# Patient Record
Sex: Female | Born: 1935 | Race: White | Hispanic: No | Marital: Married | Smoking: Former smoker
Health system: Southern US, Community
[De-identification: ages and names within clinical notes are randomized; demographics above are authoritative.]

## PROBLEM LIST (undated history)

## (undated) DIAGNOSIS — Z9889 Other specified postprocedural states: Secondary | ICD-10-CM

## (undated) DIAGNOSIS — M8580 Other specified disorders of bone density and structure, unspecified site: Secondary | ICD-10-CM

## (undated) DIAGNOSIS — I10 Essential (primary) hypertension: Secondary | ICD-10-CM

## (undated) DIAGNOSIS — E785 Hyperlipidemia, unspecified: Secondary | ICD-10-CM

## (undated) HISTORY — PX: TONSILLECTOMY: SUR1361

## (undated) HISTORY — DX: Other specified postprocedural states: Z98.890

## (undated) HISTORY — DX: Essential (primary) hypertension: I10

## (undated) HISTORY — DX: Hyperlipidemia, unspecified: E78.5

## (undated) HISTORY — DX: Other specified disorders of bone density and structure, unspecified site: M85.80

---

## 1998-09-16 ENCOUNTER — Other Ambulatory Visit: Admission: RE | Admit: 1998-09-16 | Discharge: 1998-09-16 | Payer: Self-pay | Admitting: Obstetrics and Gynecology

## 2000-01-11 ENCOUNTER — Encounter: Payer: Self-pay | Admitting: Obstetrics and Gynecology

## 2000-01-11 ENCOUNTER — Encounter: Admission: RE | Admit: 2000-01-11 | Discharge: 2000-01-11 | Payer: Self-pay | Admitting: Obstetrics and Gynecology

## 2000-09-19 ENCOUNTER — Other Ambulatory Visit: Admission: RE | Admit: 2000-09-19 | Discharge: 2000-09-19 | Payer: Self-pay | Admitting: Obstetrics and Gynecology

## 2001-02-15 ENCOUNTER — Encounter: Admission: RE | Admit: 2001-02-15 | Discharge: 2001-02-15 | Payer: Self-pay | Admitting: Obstetrics and Gynecology

## 2001-03-07 ENCOUNTER — Encounter: Admission: RE | Admit: 2001-03-07 | Discharge: 2001-03-07 | Payer: Self-pay | Admitting: Obstetrics and Gynecology

## 2001-03-07 ENCOUNTER — Encounter: Payer: Self-pay | Admitting: Obstetrics and Gynecology

## 2001-03-11 ENCOUNTER — Encounter: Payer: Self-pay | Admitting: Obstetrics and Gynecology

## 2001-03-11 ENCOUNTER — Encounter: Admission: RE | Admit: 2001-03-11 | Discharge: 2001-03-11 | Payer: Self-pay | Admitting: Obstetrics and Gynecology

## 2001-05-21 ENCOUNTER — Encounter: Payer: Self-pay | Admitting: Obstetrics and Gynecology

## 2001-05-21 ENCOUNTER — Encounter: Admission: RE | Admit: 2001-05-21 | Discharge: 2001-05-21 | Payer: Self-pay | Admitting: Obstetrics and Gynecology

## 2002-05-06 ENCOUNTER — Other Ambulatory Visit: Admission: RE | Admit: 2002-05-06 | Discharge: 2002-05-06 | Payer: Self-pay | Admitting: Obstetrics and Gynecology

## 2002-05-08 ENCOUNTER — Encounter: Payer: Self-pay | Admitting: Obstetrics and Gynecology

## 2002-05-08 ENCOUNTER — Encounter: Admission: RE | Admit: 2002-05-08 | Discharge: 2002-05-08 | Payer: Self-pay | Admitting: Obstetrics and Gynecology

## 2003-07-06 ENCOUNTER — Encounter: Admission: RE | Admit: 2003-07-06 | Discharge: 2003-07-06 | Payer: Self-pay | Admitting: Obstetrics and Gynecology

## 2004-05-24 ENCOUNTER — Ambulatory Visit: Payer: Self-pay | Admitting: Internal Medicine

## 2004-06-01 ENCOUNTER — Ambulatory Visit: Payer: Self-pay | Admitting: Internal Medicine

## 2004-09-06 ENCOUNTER — Encounter: Admission: RE | Admit: 2004-09-06 | Discharge: 2004-09-06 | Payer: Self-pay | Admitting: Obstetrics and Gynecology

## 2004-09-09 ENCOUNTER — Other Ambulatory Visit: Admission: RE | Admit: 2004-09-09 | Discharge: 2004-09-09 | Payer: Self-pay | Admitting: Obstetrics and Gynecology

## 2004-11-30 ENCOUNTER — Ambulatory Visit: Payer: Self-pay | Admitting: Internal Medicine

## 2005-05-10 ENCOUNTER — Ambulatory Visit: Payer: Self-pay | Admitting: Internal Medicine

## 2005-05-31 ENCOUNTER — Ambulatory Visit: Payer: Self-pay | Admitting: Internal Medicine

## 2005-06-22 ENCOUNTER — Ambulatory Visit: Payer: Self-pay | Admitting: Internal Medicine

## 2005-08-03 ENCOUNTER — Ambulatory Visit: Payer: Self-pay | Admitting: Internal Medicine

## 2005-11-08 ENCOUNTER — Encounter: Admission: RE | Admit: 2005-11-08 | Discharge: 2005-11-08 | Payer: Self-pay | Admitting: Obstetrics and Gynecology

## 2005-12-06 ENCOUNTER — Ambulatory Visit: Payer: Self-pay | Admitting: Internal Medicine

## 2006-05-25 ENCOUNTER — Ambulatory Visit: Payer: Self-pay | Admitting: Internal Medicine

## 2006-08-28 ENCOUNTER — Ambulatory Visit: Payer: Self-pay | Admitting: Internal Medicine

## 2006-08-28 LAB — CONVERTED CEMR LAB
ALT: 15 units/L (ref 0–40)
AST: 16 units/L (ref 0–37)
Albumin: 4.2 g/dL (ref 3.5–5.2)
Alkaline Phosphatase: 41 units/L (ref 39–117)
BUN: 17 mg/dL (ref 6–23)
Bilirubin, Direct: 0.2 mg/dL (ref 0.0–0.3)
CO2: 29 meq/L (ref 19–32)
Calcium: 9.7 mg/dL (ref 8.4–10.5)
Chloride: 106 meq/L (ref 96–112)
Cholesterol: 256 mg/dL (ref 0–200)
Creatinine, Ser: 0.8 mg/dL (ref 0.4–1.2)
Direct LDL: 169 mg/dL
GFR calc Af Amer: 91 mL/min
GFR calc non Af Amer: 75 mL/min
Glucose, Bld: 102 mg/dL — ABNORMAL HIGH (ref 70–99)
HDL: 67.1 mg/dL (ref >39.0)
Potassium: 4.6 meq/L (ref 3.5–5.1)
Sodium: 142 meq/L (ref 135–145)
Total Bilirubin: 0.8 mg/dL (ref 0.3–1.2)
Total CHOL/HDL Ratio: 3.8
Total Protein: 7 g/dL (ref 6.0–8.3)
Triglycerides: 112 mg/dL (ref 0–149)
VLDL: 22 mg/dL (ref 0–40)

## 2007-01-03 ENCOUNTER — Encounter: Payer: Self-pay | Admitting: Internal Medicine

## 2007-01-22 ENCOUNTER — Other Ambulatory Visit: Admission: RE | Admit: 2007-01-22 | Discharge: 2007-01-22 | Payer: Self-pay | Admitting: Obstetrics and Gynecology

## 2007-02-07 ENCOUNTER — Encounter: Payer: Self-pay | Admitting: Internal Medicine

## 2007-02-07 ENCOUNTER — Encounter: Admission: RE | Admit: 2007-02-07 | Discharge: 2007-02-07 | Payer: Self-pay | Admitting: Internal Medicine

## 2007-02-14 DIAGNOSIS — I10 Essential (primary) hypertension: Secondary | ICD-10-CM | POA: Insufficient documentation

## 2007-02-26 DIAGNOSIS — E785 Hyperlipidemia, unspecified: Secondary | ICD-10-CM

## 2007-02-27 ENCOUNTER — Ambulatory Visit: Payer: Self-pay | Admitting: Internal Medicine

## 2007-06-12 ENCOUNTER — Ambulatory Visit: Payer: Self-pay | Admitting: Internal Medicine

## 2007-08-21 ENCOUNTER — Ambulatory Visit: Payer: Self-pay | Admitting: Internal Medicine

## 2007-08-21 LAB — CONVERTED CEMR LAB
ALT: 19 units/L (ref 0–35)
AST: 19 units/L (ref 0–37)
Albumin: 4.2 g/dL (ref 3.5–5.2)
Alkaline Phosphatase: 35 units/L — ABNORMAL LOW (ref 39–117)
BUN: 16 mg/dL (ref 6–23)
Basophils Absolute: 0 10*3/uL (ref 0.0–0.1)
Basophils Relative: 0.7 % (ref 0.0–1.0)
Bilirubin Urine: NEGATIVE
Bilirubin, Direct: 0.2 mg/dL (ref 0.0–0.3)
CO2: 32 meq/L (ref 19–32)
Calcium: 10.5 mg/dL (ref 8.4–10.5)
Chloride: 104 meq/L (ref 96–112)
Cholesterol: 230 mg/dL (ref 0–200)
Creatinine, Ser: 0.7 mg/dL (ref 0.4–1.2)
Direct LDL: 140.3 mg/dL
Eosinophils Absolute: 0.1 10*3/uL (ref 0.0–0.6)
Eosinophils Relative: 2.4 % (ref 0.0–5.0)
GFR calc Af Amer: 106 mL/min
GFR calc non Af Amer: 87 mL/min
Glucose, Bld: 99 mg/dL (ref 70–99)
Glucose, Urine, Semiquant: 100
HCT: 39.5 % (ref 36.0–46.0)
HDL: 68.2 mg/dL (ref >39.0)
Hemoglobin: 13 g/dL (ref 12.0–15.0)
Ketones, urine, test strip: NEGATIVE
Lymphocytes Relative: 32.9 % (ref 12.0–46.0)
MCHC: 33 g/dL (ref 30.0–36.0)
MCV: 91.9 fL (ref 78.0–100.0)
Monocytes Absolute: 0.3 10*3/uL (ref 0.2–0.7)
Monocytes Relative: 7.8 % (ref 3.0–11.0)
Neutro Abs: 2.1 10*3/uL (ref 1.4–7.7)
Neutrophils Relative %: 56.2 % (ref 43.0–77.0)
Nitrite: NEGATIVE
Platelets: 251 10*3/uL (ref 150–400)
Potassium: 4.7 meq/L (ref 3.5–5.1)
Protein, U semiquant: NEGATIVE
RBC: 4.3 M/uL (ref 3.87–5.11)
RDW: 12.6 % (ref 11.5–14.6)
Sodium: 143 meq/L (ref 135–145)
Specific Gravity, Urine: >=1.03
TSH: 1.7 microintl units/mL (ref 0.35–5.50)
Total Bilirubin: 1 mg/dL (ref 0.3–1.2)
Total CHOL/HDL Ratio: 3.4
Total Protein: 6.9 g/dL (ref 6.0–8.3)
Triglycerides: 50 mg/dL (ref 0–149)
Urobilinogen, UA: 0.2
VLDL: 10 mg/dL (ref 0–40)
WBC Urine, dipstick: NEGATIVE
WBC: 3.7 10*3/uL — ABNORMAL LOW (ref 4.5–10.5)
pH: 5.5

## 2007-08-29 ENCOUNTER — Ambulatory Visit: Payer: Self-pay | Admitting: Internal Medicine

## 2007-08-29 DIAGNOSIS — B372 Candidiasis of skin and nail: Secondary | ICD-10-CM

## 2007-09-20 LAB — CONVERTED CEMR LAB: Pap Smear: NORMAL

## 2007-11-12 ENCOUNTER — Telehealth: Payer: Self-pay | Admitting: *Deleted

## 2007-11-13 ENCOUNTER — Ambulatory Visit: Payer: Self-pay | Admitting: Internal Medicine

## 2007-11-13 DIAGNOSIS — T887XXA Unspecified adverse effect of drug or medicament, initial encounter: Secondary | ICD-10-CM | POA: Insufficient documentation

## 2007-11-13 LAB — CONVERTED CEMR LAB
ALT: 15 units/L (ref 0–35)
AST: 16 units/L (ref 0–37)
Albumin: 4 g/dL (ref 3.5–5.2)
Alkaline Phosphatase: 46 units/L (ref 39–117)
Bilirubin, Direct: 0.1 mg/dL (ref 0.0–0.3)
Total Bilirubin: 0.6 mg/dL (ref 0.3–1.2)
Total Protein: 6.8 g/dL (ref 6.0–8.3)

## 2007-11-15 ENCOUNTER — Telehealth: Payer: Self-pay | Admitting: Internal Medicine

## 2008-03-05 ENCOUNTER — Ambulatory Visit: Payer: Self-pay | Admitting: Internal Medicine

## 2008-03-19 ENCOUNTER — Encounter: Admission: RE | Admit: 2008-03-19 | Discharge: 2008-03-19 | Payer: Self-pay | Admitting: Obstetrics and Gynecology

## 2008-05-27 ENCOUNTER — Encounter: Payer: Self-pay | Admitting: *Deleted

## 2008-07-01 ENCOUNTER — Telehealth: Payer: Self-pay | Admitting: Internal Medicine

## 2008-08-06 ENCOUNTER — Telehealth: Payer: Self-pay | Admitting: Internal Medicine

## 2008-08-26 ENCOUNTER — Ambulatory Visit: Payer: Self-pay | Admitting: Internal Medicine

## 2008-08-26 LAB — CONVERTED CEMR LAB
ALT: 14 units/L (ref 0–35)
AST: 14 units/L (ref 0–37)
Albumin: 3.9 g/dL (ref 3.5–5.2)
Alkaline Phosphatase: 39 units/L (ref 39–117)
BUN: 18 mg/dL (ref 6–23)
Basophils Absolute: 0 10*3/uL (ref 0.0–0.1)
Basophils Relative: 0.5 % (ref 0.0–3.0)
Bilirubin Urine: NEGATIVE
Bilirubin, Direct: 0.1 mg/dL (ref 0.0–0.3)
CO2: 31 meq/L (ref 19–32)
Calcium: 9.7 mg/dL (ref 8.4–10.5)
Chloride: 104 meq/L (ref 96–112)
Cholesterol: 254 mg/dL (ref 0–200)
Creatinine, Ser: 0.7 mg/dL (ref 0.4–1.2)
Direct LDL: 162.5 mg/dL
Eosinophils Absolute: 0.1 10*3/uL (ref 0.0–0.7)
Eosinophils Relative: 3.7 % (ref 0.0–5.0)
GFR calc Af Amer: 105 mL/min
GFR calc non Af Amer: 87 mL/min
Glucose, Bld: 98 mg/dL (ref 70–99)
Glucose, Urine, Semiquant: NEGATIVE
HCT: 37.5 % (ref 36.0–46.0)
HDL: 62 mg/dL (ref >39.0)
Hemoglobin: 12.9 g/dL (ref 12.0–15.0)
Ketones, urine, test strip: NEGATIVE
Lymphocytes Relative: 33.5 % (ref 12.0–46.0)
MCHC: 34.3 g/dL (ref 30.0–36.0)
MCV: 92.9 fL (ref 78.0–100.0)
Monocytes Absolute: 0.3 10*3/uL (ref 0.1–1.0)
Monocytes Relative: 8.5 % (ref 3.0–12.0)
Neutro Abs: 2.2 10*3/uL (ref 1.4–7.7)
Neutrophils Relative %: 53.8 % (ref 43.0–77.0)
Nitrite: NEGATIVE
Platelets: 244 10*3/uL (ref 150–400)
Potassium: 4.3 meq/L (ref 3.5–5.1)
Protein, U semiquant: NEGATIVE
RBC: 4.03 M/uL (ref 3.87–5.11)
RDW: 12.2 % (ref 11.5–14.6)
Sodium: 142 meq/L (ref 135–145)
Specific Gravity, Urine: 1.015
TSH: 2.12 microintl units/mL (ref 0.35–5.50)
Total Bilirubin: 0.8 mg/dL (ref 0.3–1.2)
Total CHOL/HDL Ratio: 4.1
Total Protein: 6.9 g/dL (ref 6.0–8.3)
Triglycerides: 96 mg/dL (ref 0–149)
Urobilinogen, UA: 0.2
VLDL: 19 mg/dL (ref 0–40)
WBC: 3.9 10*3/uL — ABNORMAL LOW (ref 4.5–10.5)
pH: 7

## 2008-09-02 ENCOUNTER — Ambulatory Visit: Payer: Self-pay | Admitting: Internal Medicine

## 2008-10-06 ENCOUNTER — Telehealth (INDEPENDENT_AMBULATORY_CARE_PROVIDER_SITE_OTHER): Payer: Self-pay | Admitting: *Deleted

## 2008-10-07 ENCOUNTER — Telehealth: Payer: Self-pay | Admitting: Internal Medicine

## 2008-10-21 LAB — CONVERTED CEMR LAB: Pap Smear: NORMAL

## 2008-12-14 ENCOUNTER — Ambulatory Visit: Payer: Self-pay | Admitting: Internal Medicine

## 2008-12-14 LAB — CONVERTED CEMR LAB
ALT: 19 units/L (ref 0–35)
AST: 18 units/L (ref 0–37)
Albumin: 4 g/dL (ref 3.5–5.2)
Alkaline Phosphatase: 43 units/L (ref 39–117)
Bilirubin, Direct: 0.1 mg/dL (ref 0.0–0.3)
Cholesterol: 222 mg/dL — ABNORMAL HIGH (ref 0–200)
Direct LDL: 142 mg/dL
HDL: 63.1 mg/dL (ref >39.00)
Total Bilirubin: 1 mg/dL (ref 0.3–1.2)
Total CHOL/HDL Ratio: 4
Total Protein: 7.1 g/dL (ref 6.0–8.3)
Triglycerides: 91 mg/dL (ref 0.0–149.0)
VLDL: 18.2 mg/dL (ref 0.0–40.0)

## 2008-12-31 ENCOUNTER — Ambulatory Visit: Payer: Self-pay | Admitting: Internal Medicine

## 2009-02-20 LAB — HM MAMMOGRAPHY

## 2009-05-19 ENCOUNTER — Ambulatory Visit: Payer: Self-pay | Admitting: Internal Medicine

## 2009-05-19 DIAGNOSIS — R635 Abnormal weight gain: Secondary | ICD-10-CM

## 2009-08-24 LAB — HM MAMMOGRAPHY

## 2009-09-07 ENCOUNTER — Encounter: Admission: RE | Admit: 2009-09-07 | Discharge: 2009-09-07 | Payer: Self-pay | Admitting: Obstetrics and Gynecology

## 2009-09-08 ENCOUNTER — Ambulatory Visit: Payer: Self-pay | Admitting: Internal Medicine

## 2009-09-08 LAB — CONVERTED CEMR LAB
ALT: 15 units/L (ref 0–35)
AST: 16 units/L (ref 0–37)
Albumin: 4 g/dL (ref 3.5–5.2)
Alkaline Phosphatase: 40 units/L (ref 39–117)
BUN: 14 mg/dL (ref 6–23)
Basophils Absolute: 0 10*3/uL (ref 0.0–0.1)
Basophils Relative: 0.6 % (ref 0.0–3.0)
Bilirubin Urine: NEGATIVE
Bilirubin, Direct: 0.2 mg/dL (ref 0.0–0.3)
Blood in Urine, dipstick: NEGATIVE
CO2: 32 meq/L (ref 19–32)
Calcium: 9.3 mg/dL (ref 8.4–10.5)
Chloride: 108 meq/L (ref 96–112)
Cholesterol: 185 mg/dL (ref 0–200)
Creatinine, Ser: 0.6 mg/dL (ref 0.4–1.2)
Eosinophils Absolute: 0.1 10*3/uL (ref 0.0–0.7)
Eosinophils Relative: 3.1 % (ref 0.0–5.0)
GFR calc non Af Amer: 103.7 mL/min (ref >60)
Glucose, Bld: 95 mg/dL (ref 70–99)
Glucose, Urine, Semiquant: NEGATIVE
HCT: 38 % (ref 36.0–46.0)
HDL: 72.9 mg/dL (ref >39.00)
Hemoglobin: 12.6 g/dL (ref 12.0–15.0)
Ketones, urine, test strip: NEGATIVE
LDL Cholesterol: 97 mg/dL (ref 0–99)
Lymphocytes Relative: 31 % (ref 12.0–46.0)
Lymphs Abs: 1.1 10*3/uL (ref 0.7–4.0)
MCHC: 33.1 g/dL (ref 30.0–36.0)
MCV: 94.3 fL (ref 78.0–100.0)
Monocytes Absolute: 0.3 10*3/uL (ref 0.1–1.0)
Monocytes Relative: 9 % (ref 3.0–12.0)
Neutro Abs: 2.1 10*3/uL (ref 1.4–7.7)
Neutrophils Relative %: 56.3 % (ref 43.0–77.0)
Nitrite: NEGATIVE
Platelets: 217 10*3/uL (ref 150.0–400.0)
Potassium: 4.2 meq/L (ref 3.5–5.1)
Protein, U semiquant: NEGATIVE
RBC: 4.03 M/uL (ref 3.87–5.11)
RDW: 12.2 % (ref 11.5–14.6)
Sodium: 142 meq/L (ref 135–145)
Specific Gravity, Urine: 1.02
TSH: 1.63 microintl units/mL (ref 0.35–5.50)
Total Bilirubin: 0.9 mg/dL (ref 0.3–1.2)
Total CHOL/HDL Ratio: 3
Total Protein: 6.9 g/dL (ref 6.0–8.3)
Triglycerides: 76 mg/dL (ref 0.0–149.0)
Urobilinogen, UA: 0.2
VLDL: 15.2 mg/dL (ref 0.0–40.0)
WBC Urine, dipstick: NEGATIVE
WBC: 3.6 10*3/uL — ABNORMAL LOW (ref 4.5–10.5)
pH: 8

## 2009-09-15 ENCOUNTER — Ambulatory Visit: Payer: Self-pay | Admitting: Internal Medicine

## 2010-04-18 ENCOUNTER — Ambulatory Visit: Payer: Self-pay | Admitting: Internal Medicine

## 2010-06-30 ENCOUNTER — Telehealth: Payer: Self-pay | Admitting: Internal Medicine

## 2010-07-12 ENCOUNTER — Telehealth: Payer: Self-pay | Admitting: Internal Medicine

## 2010-07-28 ENCOUNTER — Ambulatory Visit
Admission: RE | Admit: 2010-07-28 | Discharge: 2010-07-28 | Payer: Self-pay | Source: Home / Self Care | Attending: Internal Medicine | Admitting: Internal Medicine

## 2010-08-13 ENCOUNTER — Encounter: Payer: Self-pay | Admitting: Obstetrics and Gynecology

## 2010-08-21 LAB — CONVERTED CEMR LAB
Cholesterol, target level: 200 mg/dL
HDL goal, serum: 40 mg/dL
LDL Goal: 160 mg/dL

## 2010-08-25 NOTE — Assessment & Plan Note (Signed)
Summary: 6 month rov/njr rsc appt time/njr   Vital Signs:  Patient profile:   77 year old female Height:      62 inches Weight:      130 pounds BMI:     23.86 Temp:     98.2 degrees F oral Pulse rate:   72 / minute Resp:     14 per minute BP sitting:   136 / 80  (left arm)  Vitals Entered By: Willy Eddy, LPN (April 18, 2010 1:53 PM) CC: roa, Hypertension Management Is Patient Diabetic? No   Primary Care Provider:  Stacie Glaze MD  CC:  roa and Hypertension Management.  History of Present Illness: folow up vustion fro HTN and lipids and mild anxiety review of immunizations prior to trip  Follow-Up Visit      This is a 77 year old woman who presents for Follow-up visit.  The patient denies chest pain, palpitations, dizziness, syncope, low blood sugar symptoms, high blood sugar symptoms, edema, SOB, DOE, PND, and orthopnea.  Since the last visit the patient notes no new problems or concerns.  The patient reports taking meds as prescribed and monitoring BP.  When questioned about possible medication side effects, the patient notes none.    Hypertension History:      She denies headache, chest pain, palpitations, dyspnea with exertion, orthopnea, PND, peripheral edema, visual symptoms, neurologic problems, syncope, and side effects from treatment.        Positive major cardiovascular risk factors include female age 68 years old or older, hyperlipidemia, and hypertension.  Negative major cardiovascular risk factors include non-tobacco-user status.        Further assessment for target organ damage reveals no history of ASHD, stroke/TIA, or peripheral vascular disease.     Preventive Screening-Counseling & Management  Alcohol-Tobacco     Smoking Status: quit     Year Quit: 1975  Problems Prior to Update: 1)  Weight Gain  (ICD-783.1) 2)  Uns Advrs Eff Uns Rx Medicinal&biological Sbstnc  (ICD-995.20) 3)  Candidiasis of Skin and Nails  (ICD-112.3) 4)  Preventive Health  Care  (ICD-V70.0) 5)  Grief Reaction  (ICD-309.0) 6)  Hyperlipidemia  (ICD-272.4) 7)  Hypertension  (ICD-401.9)  Current Problems (verified): 1)  Weight Gain  (ICD-783.1) 2)  Uns Advrs Eff Uns Rx Medicinal&biological Sbstnc  (ICD-995.20) 3)  Candidiasis of Skin and Nails  (ICD-112.3) 4)  Preventive Health Care  (ICD-V70.0) 5)  Grief Reaction  (ICD-309.0) 6)  Hyperlipidemia  (ICD-272.4) 7)  Hypertension  (ICD-401.9)  Medications Prior to Update: 1)  Norvasc 5 Mg  Tabs (Amlodipine Besylate) .... Once Po Daily 2)  Multivitamins   Tabs (Multiple Vitamin) .... Once Daily 3)  Bayer Low Strength 81 Mg  Tbec (Aspirin) .... Once Daily 4)  Hydrochlorothiazide 12.5 Mg  Caps (Hydrochlorothiazide) .... Once Po  Daily 5)  Alprazolam 0.25 Mg  Tabs (Alprazolam) .Marland Kitchen.. 1 -2 Every 4-6 Hours As Needed 6)  Caltrate 600 1500 Mg  Tabs (Calcium Carbonate) .... Once Daily 7)  Fish Oil Concentrate 1000 Mg Caps (Omega-3 Fatty Acids) .... Two By Mouth Two Times A Day 8)  Red Yeast Rice Extract 600 Mg Caps (Red Yeast Rice Extract) .... One By Mouth Two Times A Day 9)  Promethazine Hcl 25 Mg Tabs (Promethazine Hcl) .... One By Mouth Q 6 Hours As Needed For Nausea  Current Medications (verified): 1)  Norvasc 5 Mg  Tabs (Amlodipine Besylate) .... Once Po Daily 2)  Multivitamins  Tabs (Multiple Vitamin) .... Once Daily 3)  Bayer Low Strength 81 Mg  Tbec (Aspirin) .... Once Daily 4)  Hydrochlorothiazide 12.5 Mg  Caps (Hydrochlorothiazide) .... Once Po  Daily 5)  Alprazolam 0.25 Mg  Tabs (Alprazolam) .Marland Kitchen.. 1 -2 Every 4-6 Hours As Needed 6)  Caltrate 600 1500 Mg  Tabs (Calcium Carbonate) .... Once Daily 7)  Fish Oil Concentrate 1000 Mg Caps (Omega-3 Fatty Acids) .... Two By Mouth Two Times A Day 8)  Red Yeast Rice Extract 600 Mg Caps (Red Yeast Rice Extract) .... One By Mouth Two Times A Day 9)  Promethazine Hcl 25 Mg Tabs (Promethazine Hcl) .... One By Mouth Q 6 Hours As Needed For Nausea 10)  Cipro 500 Mg Tabs  (Ciprofloxacin Hcl) .Marland Kitchen.. 1 Two Times A Day As Needed Travelers Diarrhea  Allergies (verified): 1)  ! Penicillin 2)  ! Codeine  Past History:  Family History: Last updated: 08/29/2007 mother passed at 15 AAA father 74 unknown Family History Hypertension  Social History: Last updated: 08/29/2007 Former Smoker Married Alcohol use-no Drug use-no Regular exercise-yes  Risk Factors: Exercise: yes (08/29/2007)  Risk Factors: Smoking Status: quit (04/18/2010)  Past medical, surgical, family and social histories (including risk factors) reviewed, and no changes noted (except as noted below).  Past Medical History: Reviewed history from 02/27/2007 and no changes required. Hypertension Lipids Hyperlipidemia osteopenia GXT had stress test 2007 MI work up that was neg  Past Surgical History: Reviewed history from 02/27/2007 and no changes required. Tonsillectomy Lumpectomy  bx of mass neg  Family History: Reviewed history from 08/29/2007 and no changes required. mother passed at 65 AAA father 7 unknown Family History Hypertension  Social History: Reviewed history from 08/29/2007 and no changes required. Former Smoker Married Alcohol use-no Drug use-no Regular exercise-yes  Review of Systems  The patient denies anorexia, fever, weight loss, weight gain, vision loss, decreased hearing, hoarseness, chest pain, syncope, dyspnea on exertion, peripheral edema, prolonged cough, headaches, hemoptysis, abdominal pain, melena, hematochezia, severe indigestion/heartburn, hematuria, incontinence, genital sores, muscle weakness, suspicious skin lesions, transient blindness, difficulty walking, depression, unusual weight change, abnormal bleeding, enlarged lymph nodes, angioedema, and breast masses.    Physical Exam  General:  Well-developed,well-nourished,in no acute distress; alert,appropriate and cooperative throughout examination Head:  Normocephalic and atraumatic without  obvious abnormalities. No apparent alopecia or balding. Eyes:  pupils equal and pupils round.   Ears:  R ear normal and L ear normal.   Nose:  no external deformity and no nasal discharge.   Neck:  No deformities, masses, or tenderness noted. Lungs:  Normal respiratory effort, chest expands symmetrically. Lungs are clear to auscultation, no crackles or wheezes. Heart:  Normal rate and regular rhythm. S1 and S2 normal without gallop, murmur, click, rub or other extra sounds. Abdomen:  Bowel sounds positive,abdomen soft and non-tender without masses, organomegaly or hernias noted. Msk:  normal ROM, no joint tenderness, and no joint swelling.   Pulses:  R and L carotid,radial,femoral,dorsalis pedis and posterior tibial pulses are full and equal bilaterally Extremities:  No clubbing, cyanosis, edema, or deformity noted with normal full range of motion of all joints.   Neurologic:  No cranial nerve deficits noted. Station and gait are normal. Plantar reflexes are down-going bilaterally. DTRs are symmetrical throughout. Sensory, motor and coordinative functions appear intact.   Complete Medication List: 1)  Norvasc 5 Mg Tabs (Amlodipine besylate) .... Once po daily 2)  Multivitamins Tabs (Multiple vitamin) .... Once daily 3)  Bayer Low Strength 81  Mg Tbec (Aspirin) .... Once daily 4)  Hydrochlorothiazide 12.5 Mg Caps (Hydrochlorothiazide) .... Once po  daily 5)  Alprazolam 0.25 Mg Tabs (Alprazolam) .Marland Kitchen.. 1 -2 every 4-6 hours as needed 6)  Caltrate 600 1500 Mg Tabs (Calcium carbonate) .... Once daily 7)  Fish Oil Concentrate 1000 Mg Caps (Omega-3 fatty acids) .... Two by mouth two times a day 8)  Red Yeast Rice Extract 600 Mg Caps (Red yeast rice extract) .... One by mouth two times a day 9)  Promethazine Hcl 25 Mg Tabs (Promethazine hcl) .... One by mouth q 6 hours as needed for nausea 10)  Cipro 500 Mg Tabs (Ciprofloxacin hcl) .Marland Kitchen.. 1 two times a day as needed travelers diarrhea  Hypertension  Assessment/Plan:      The patient's hypertensive risk group is category B: At least one risk factor (excluding diabetes) with no target organ damage.  Her calculated 10 year risk of coronary heart disease is 5 %.  Today's blood pressure is 136/80.  Her blood pressure goal is < 140/90.  Patient Instructions: 1)  Please schedule a follow-up appointment in 6 months.  CPX Prescriptions: ALPRAZOLAM 0.25 MG  TABS (ALPRAZOLAM) 1 -2 every 4-6 hours as needed  #30 x 1   Entered and Authorized by:   Stacie Glaze MD   Signed by:   Stacie Glaze MD on 04/18/2010   Method used:   Print then Give to Patient   RxID:   [id number] CIPRO 500 MG TABS (CIPROFLOXACIN HCL) 1 two times a day as needed travelers diarrhea  #14 x 0   Entered by:   Willy Eddy, LPN   Authorized by:   Stacie Glaze MD   Signed by:   Stacie Glaze MD on 04/18/2010   Method used:   Electronically to        [facility]. [phone]* (retail)       [address]       [place], [place]  [address]       Ph: [phone]       Fax: [phone]   RxID:   [id number]    Immunization History:  Influenza Immunization History:    Influenza:  historical (03/24/2010)

## 2010-08-25 NOTE — Assessment & Plan Note (Signed)
Summary: cough//ccm   Vital Signs:  Patient profile:   77 year old female Weight:      130 pounds Pulse rate:   80 / minute BP sitting:   150 / 90  Vitals Entered By: Kyung Rudd, CMA (July 28, 2010 12:14 PM) CC: c/o cold for 3wks but can't get rid of cough.   Primary Care Provider:  Stacie Glaze MD  CC:  c/o cold for 3wks but can't get rid of cough..  History of Present Illness: Patient presents to clinic as a workin for evaluation of cough. Notes 3wk of NP cough without hemoptysis. Cough worse during day. Attempted zicam at beginning of symptoms. No sick exposure. +malaise and denies rhinorrhea, f/c, dyspnea or wheezing. No other complaints.  Current Medications (verified): 1)  Norvasc 5 Mg  Tabs (Amlodipine Besylate) .... Once Po Daily 2)  Multivitamins   Tabs (Multiple Vitamin) .... Once Daily 3)  Bayer Low Strength 81 Mg  Tbec (Aspirin) .... Once Daily 4)  Hydrochlorothiazide 12.5 Mg  Caps (Hydrochlorothiazide) .... Once Po  Daily 5)  Alprazolam 0.25 Mg  Tabs (Alprazolam) .Marland Kitchen.. 1 -2 Every 4-6 Hours As Needed 6)  Caltrate 600 1500 Mg  Tabs (Calcium Carbonate) .... Once Daily 7)  Fish Oil Concentrate 1000 Mg Caps (Omega-3 Fatty Acids) .... Two By Mouth Two Times A Day 8)  Red Yeast Rice Extract 600 Mg Caps (Red Yeast Rice Extract) .... One By Mouth Two Times A Day 9)  Promethazine Hcl 25 Mg Tabs (Promethazine Hcl) .... One By Mouth Q 6 Hours As Needed For Nausea 10)  Cipro 500 Mg Tabs (Ciprofloxacin Hcl) .Marland Kitchen.. 1 Two Times A Day As Needed Travelers Diarrhea  Allergies (verified): 1)  ! Penicillin 2)  ! Codeine  Past History:  Past medical, surgical, family and social histories (including risk factors) reviewed for relevance to current acute and chronic problems.  Past Medical History: Reviewed history from 02/27/2007 and no changes required. Hypertension Lipids Hyperlipidemia osteopenia GXT had stress test 2007 MI work up that was neg  Past Surgical  History: Reviewed history from 02/27/2007 and no changes required. Tonsillectomy Lumpectomy  bx of mass neg  Family History: Reviewed history from 08/29/2007 and no changes required. mother passed at 51 AAA father 16 unknown Family History Hypertension  Social History: Reviewed history from 08/29/2007 and no changes required. Former Smoker Married Alcohol use-no Drug use-no Regular exercise-yes  Review of Systems      See HPI General:  Complains of fatigue and malaise; denies chills and sweats. Eyes:  Denies eye irritation and red eye. ENT:  Complains of nasal congestion and postnasal drainage; denies difficulty swallowing, ear discharge, and sore throat. Resp:  Complains of cough; denies coughing up blood, shortness of breath, sputum productive, and wheezing. Derm:  Denies changes in color of skin and rash.  Physical Exam  General:  Well-developed,well-nourished,in no acute distress; alert,appropriate and cooperative throughout examination Head:  Normocephalic and atraumatic without obvious abnormalities. No apparent alopecia or balding. Eyes:  pupils equal, pupils round, corneas and lenses clear, and no injection.   Ears:  External ear exam shows no significant lesions or deformities.  Otoscopic examination reveals clear canals, tympanic membranes are intact bilaterally without bulging, retraction, inflammation or discharge. Hearing is grossly normal bilaterally. Nose:  no external deformity, no nasal discharge, and no mucosal edema.   Mouth:  Oral mucosa and oropharynx without lesions or exudates.  Teeth in good repair. Neck:  No deformities, masses, or tenderness  noted. Lungs:  Normal respiratory effort, chest expands symmetrically. Lungs are clear to auscultation, no crackles or wheezes. Heart:  Normal rate and regular rhythm. S1 and S2 normal without gallop, murmur, click, rub or other extra sounds. Skin:  turgor normal, color normal, and no rashes.     Impression &  Recommendations:  Problem # 1:  ACUTE BRONCHITIS (ICD-466.0) Assessment New Begin abx tx. Followup if no improvement or worsening.  Her updated medication list for this problem includes:    Zithromax Z-pak 250 Mg Tabs (Azithromycin) .Marland Kitchen... As directed  Complete Medication List: 1)  Norvasc 5 Mg Tabs (Amlodipine besylate) .... Once po daily 2)  Multivitamins Tabs (Multiple vitamin) .... Once daily 3)  Bayer Low Strength 81 Mg Tbec (Aspirin) .... Once daily 4)  Hydrochlorothiazide 12.5 Mg Caps (Hydrochlorothiazide) .... Once po  daily 5)  Alprazolam 0.25 Mg Tabs (Alprazolam) .Marland Kitchen.. 1 -2 every 4-6 hours as needed 6)  Caltrate 600 1500 Mg Tabs (Calcium carbonate) .... Once daily 7)  Fish Oil Concentrate 1000 Mg Caps (Omega-3 fatty acids) .... Two by mouth two times a day 8)  Red Yeast Rice Extract 600 Mg Caps (Red yeast rice extract) .... One by mouth two times a day 9)  Promethazine Hcl 25 Mg Tabs (Promethazine hcl) .... One by mouth q 6 hours as needed for nausea 10)  Cipro 500 Mg Tabs (Ciprofloxacin hcl) .Marland Kitchen.. 1 two times a day as needed travelers diarrhea 11)  Zithromax Z-pak 250 Mg Tabs (Azithromycin) .... As directed Prescriptions: ZITHROMAX Z-PAK 250 MG TABS (AZITHROMYCIN) as directed  #1 x 0   Entered and Authorized by:   Edwyna Perfect MD   Signed by:   Edwyna Perfect MD on 07/28/2010   Method used:   Electronically to        [facility]. [phone]* (retail)       [address]       [place], [place]  [address]       Ph: [phone]       Fax: [phone]   RxID:   [id number]    Orders Added: 1)  Est. Patient Level III [47425]

## 2010-08-25 NOTE — Progress Notes (Signed)
Summary: cough  Phone Note Call from Patient Call back at [phone]   Caller: Patient---triage vm Reason for Call: Acute Illness Complaint: Cough/Sore throat Summary of Call: has a cough during ther day. What can she take? Initial call taken by: Warnell Forester,  July 12, 2010 9:42 AM  Follow-up for Phone Call        will try delsym Follow-up by: Willy Eddy, LPN,  July 12, 2010 1:46 PM

## 2010-08-25 NOTE — Assessment & Plan Note (Signed)
Summary: CPX / RS   Vital Signs:  Patient profile:   77 year old female Height:      62 inches Weight:      132 pounds BMI:     24.23 Temp:     98.2 degrees F oral Pulse rate:   721 / minute Resp:     14 per minute BP sitting:   132 / 80  (left arm)  Vitals Entered By: Willy Eddy, LPN (September 15, 2009 11:10 AM) CC: annual visit for disease managment/needs meds to take to [place]- c/o allergies   CC:  annual visit for disease managment/needs meds to take to [place]- c/o allergies.  History of Present Illness: The pt was asked about all immunizations, health maint. services that are appropriate to their age and was given guidance on diet exercize  and weight management     Preventive Screening-Counseling & Management  Alcohol-Tobacco     Smoking Status: quit     Year Quit: 1975  Problems Prior to Update: 1)  Weight Gain  (ICD-783.1) 2)  Uns Advrs Eff Uns Rx Medicinal&biological Sbstnc  (ICD-995.20) 3)  Candidiasis of Skin and Nails  (ICD-112.3) 4)  Preventive Health Care  (ICD-V70.0) 5)  Grief Reaction  (ICD-309.0) 6)  Hyperlipidemia  (ICD-272.4) 7)  Hypertension  (ICD-401.9)  Current Problems (verified): 1)  Weight Gain  (ICD-783.1) 2)  Uns Advrs Eff Uns Rx Medicinal&biological Sbstnc  (ICD-995.20) 3)  Candidiasis of Skin and Nails  (ICD-112.3) 4)  Preventive Health Care  (ICD-V70.0) 5)  Grief Reaction  (ICD-309.0) 6)  Hyperlipidemia  (ICD-272.4) 7)  Hypertension  (ICD-401.9)  Medications Prior to Update: 1)  Norvasc 5 Mg  Tabs (Amlodipine Besylate) .... Once Po Daily 2)  Multivitamins   Tabs (Multiple Vitamin) .... Once Daily 3)  Bayer Low Strength 81 Mg  Tbec (Aspirin) .... Once Daily 4)  Hydrochlorothiazide 12.5 Mg  Caps (Hydrochlorothiazide) .... Once Po  Daily 5)  Alprazolam 0.25 Mg  Tabs (Alprazolam) .Marland Kitchen.. 1 -2 Every 4-6 Hours As Needed 6)  Caltrate 600 1500 Mg  Tabs (Calcium Carbonate) .... Once Daily 7)  Fish Oil Concentrate 1000 Mg Caps (Omega-3  Fatty Acids) .... Two By Mouth Two Times A Day 8)  Red Yeast Rice Extract 600 Mg Caps (Red Yeast Rice Extract) .... One By Mouth Two Times A Day  Current Medications (verified): 1)  Norvasc 5 Mg  Tabs (Amlodipine Besylate) .... Once Po Daily 2)  Multivitamins   Tabs (Multiple Vitamin) .... Once Daily 3)  Bayer Low Strength 81 Mg  Tbec (Aspirin) .... Once Daily 4)  Hydrochlorothiazide 12.5 Mg  Caps (Hydrochlorothiazide) .... Once Po  Daily 5)  Alprazolam 0.25 Mg  Tabs (Alprazolam) .Marland Kitchen.. 1 -2 Every 4-6 Hours As Needed 6)  Caltrate 600 1500 Mg  Tabs (Calcium Carbonate) .... Once Daily 7)  Fish Oil Concentrate 1000 Mg Caps (Omega-3 Fatty Acids) .... Two By Mouth Two Times A Day 8)  Red Yeast Rice Extract 600 Mg Caps (Red Yeast Rice Extract) .... One By Mouth Two Times A Day  Allergies (verified): 1)  ! Penicillin 2)  ! Codeine  Past History:  Family History: Last updated: 08/29/2007 mother passed at 66 AAA father 80 unknown Family History Hypertension  Social History: Last updated: 08/29/2007 Former Smoker Married Alcohol use-no Drug use-no Regular exercise-yes  Risk Factors: Exercise: yes (08/29/2007)  Risk Factors: Smoking Status: quit (09/15/2009)  Past medical, surgical, family and social histories (including risk factors) reviewed, and no changes  noted (except as noted below).  Past Medical History: Reviewed history from 02/27/2007 and no changes required. Hypertension Lipids Hyperlipidemia osteopenia GXT had stress test 2007 MI work up that was neg  Past Surgical History: Reviewed history from 02/27/2007 and no changes required. Tonsillectomy Lumpectomy  bx of mass neg  Family History: Reviewed history from 08/29/2007 and no changes required. mother passed at 31 AAA father 14 unknown Family History Hypertension  Social History: Reviewed history from 08/29/2007 and no changes required. Former Smoker Married Alcohol use-no Drug use-no Regular  exercise-yes  Review of Systems  The patient denies anorexia, fever, weight loss, weight gain, vision loss, decreased hearing, hoarseness, chest pain, syncope, dyspnea on exertion, peripheral edema, prolonged cough, headaches, hemoptysis, abdominal pain, melena, hematochezia, severe indigestion/heartburn, hematuria, incontinence, genital sores, muscle weakness, suspicious skin lesions, transient blindness, difficulty walking, depression, unusual weight change, abnormal bleeding, enlarged lymph nodes, angioedema, and breast masses.    Physical Exam  General:  Well-developed,well-nourished,in no acute distress; alert,appropriate and cooperative throughout examination Head:  Normocephalic and atraumatic without obvious abnormalities. No apparent alopecia or balding. Eyes:  pupils equal and pupils round.   Ears:  R ear normal and L ear normal.   Nose:  no external deformity and no nasal discharge.   Neck:  No deformities, masses, or tenderness noted. Lungs:  Normal respiratory effort, chest expands symmetrically. Lungs are clear to auscultation, no crackles or wheezes. Heart:  Normal rate and regular rhythm. S1 and S2 normal without gallop, murmur, click, rub or other extra sounds. Abdomen:  Bowel sounds positive,abdomen soft and non-tender without masses, organomegaly or hernias noted. Pulses:  R and L carotid,radial,femoral,dorsalis pedis and posterior tibial pulses are full and equal bilaterally Extremities:  No clubbing, cyanosis, edema, or deformity noted with normal full range of motion of all joints.   Neurologic:  No cranial nerve deficits noted. Station and gait are normal. Plantar reflexes are down-going bilaterally. DTRs are symmetrical throughout. Sensory, motor and coordinative functions appear intact.   Impression & Recommendations:  Problem # 1:  PREVENTIVE HEALTH CARE (ICD-V70.0) Assessment Unchanged  Mammogram: normal (02/20/2009) Pap smear: normal (10/21/2008) Bone Density:  normal (02/07/2007) Td Booster: Td (08/29/2007)   Flu Vax: Fluvax 3+ (05/19/2009)   Pneumovax: Historical (05/24/2006) Chol: 185 (09/08/2009)   HDL: 72.90 (09/08/2009)   LDL: 97 (09/08/2009)   TG: 76.0 (09/08/2009) TSH: 1.63 (09/08/2009)   Next mammogram due:: 02/2010 (09/15/2009) Next Bone Density due:: 02/2010 (09/15/2009)  Discussed using sunscreen, use of alcohol, drug use, self breast exam, routine dental care, routine eye care, schedule for GYN exam, routine physical exam, seat belts, multiple vitamins, osteoporosis prevention, adequate calcium intake in diet, recommendations for immunizations, mammograms and Pap smears.  Discussed exercise and checking cholesterol.  Discussed gun safety, safe sex, and contraception.  Problem # 2:  HYPERTENSION (ICD-401.9) Assessment: Unchanged  Her updated medication list for this problem includes:    Norvasc 5 Mg Tabs (Amlodipine besylate) ..... Once po daily    Hydrochlorothiazide 12.5 Mg Caps (Hydrochlorothiazide) ..... Once po  daily  BP today: 132/80 Prior BP: 134/80 (05/19/2009)  Prior 10 Yr Risk Heart Disease: Not enough information (08/29/2007)  Labs Reviewed: K+: 4.2 (09/08/2009) Creat: : 0.6 (09/08/2009)   Chol: 185 (09/08/2009)   HDL: 72.90 (09/08/2009)   LDL: 97 (09/08/2009)   TG: 76.0 (09/08/2009)  Complete Medication List: 1)  Norvasc 5 Mg Tabs (Amlodipine besylate) .... Once po daily 2)  Multivitamins Tabs (Multiple vitamin) .... Once daily 3)  Bayer Low Strength  81 Mg Tbec (Aspirin) .... Once daily 4)  Hydrochlorothiazide 12.5 Mg Caps (Hydrochlorothiazide) .... Once po  daily 5)  Alprazolam 0.25 Mg Tabs (Alprazolam) .Marland Kitchen.. 1 -2 every 4-6 hours as needed 6)  Caltrate 600 1500 Mg Tabs (Calcium carbonate) .... Once daily 7)  Fish Oil Concentrate 1000 Mg Caps (Omega-3 fatty acids) .... Two by mouth two times a day 8)  Red Yeast Rice Extract 600 Mg Caps (Red yeast rice extract) .... One by mouth two times a day 9)  Ciprofloxacin Hcl  500 Mg Tabs (Ciprofloxacin hcl) .... One by mouth two times a day for 7 days as needed diarrhea 10)  Promethazine Hcl 25 Mg Tabs (Promethazine hcl) .... One by mouth q 6 hours as needed for nausea 11)  Lomotil 2.5-0.025 Mg Tabs (Diphenoxylate-atropine) .... One post loos stolls up to 4 a day  Patient Instructions: 1)  Please schedule a follow-up appointment in 6 months. 2)  Take an Aspirin every day. Prescriptions: LOMOTIL 2.5-0.025 MG TABS (DIPHENOXYLATE-ATROPINE) one post loos stolls up to 4 a day  #6 x 0   Entered and Authorized by:   Stacie Glaze MD   Signed by:   Stacie Glaze MD on 09/15/2009   Method used:   Print then Give to Patient   RxID:   [id number] PROMETHAZINE HCL 25 MG TABS (PROMETHAZINE HCL) one by mouth q 6 hours as needed for nausea  #6 x 0   Entered and Authorized by:   Stacie Glaze MD   Signed by:   Stacie Glaze MD on 09/15/2009   Method used:   Print then Give to Patient   RxID:   [id number] CIPROFLOXACIN HCL 500 MG TABS (CIPROFLOXACIN HCL) one by mouth two times a day for 7 days as needed diarrhea  #14 x 0   Entered and Authorized by:   Stacie Glaze MD   Signed by:   Stacie Glaze MD on 09/15/2009   Method used:   Print then Give to Patient   RxID:   [id number]    Preventive Care Screening  Bone Density:    Date:  02/07/2007    Next Due:  02/2010    Results:  normal std dev  Mammogram:    Date:  02/20/2009    Next Due:  02/2010    Results:  normal   Pap Smear:    Date:  10/21/2008    Next Due:  10/2009    Results:  normal

## 2010-08-25 NOTE — Progress Notes (Signed)
Summary: REFILL REQUEST  Phone Note Refill Request Message from:  Patient on June 30, 2010 3:47 PM  Refills Requested: Medication #1:  HYDROCHLOROTHIAZIDE 12.5 MG  CAPS once po  daily   Notes: [facility] [address]...Marland KitchenMarland Kitchen 90-day supply.  Medication #2:  NORVASC 5 MG  TABS once po daily   Notes: [facility] [address]...Marland KitchenMarland Kitchen 90-day supply.    Initial call taken by: Debbra Riding,  June 30, 2010 3:48 PM    Prescriptions: NORVASC 5 MG  TABS (AMLODIPINE BESYLATE) once po daily  #30 Tablet x 10   Entered by:   Willy Eddy, LPN   Authorized by:   Stacie Glaze MD   Signed by:   Willy Eddy, LPN on 02/72/5366   Method used:   Electronically to        [facility]. [phone]* (retail)       [address]       [place], [place]  [address]       Ph: [phone]       Fax: [phone]   RxID:   [id number] HYDROCHLOROTHIAZIDE 12.5 MG  CAPS (HYDROCHLOROTHIAZIDE) once po  daily  #30 Capsule x 8   Entered by:   Willy Eddy, LPN   Authorized by:   Stacie Glaze MD   Signed by:   Willy Eddy, LPN on 32/20/2542   Method used:   Electronically to        [facility]. [phone]* (retail)       [address]       [place], [place]  [address]       Ph: [phone]       Fax: [phone]   RxID:   [id number]

## 2010-09-26 ENCOUNTER — Other Ambulatory Visit: Payer: Self-pay | Admitting: Internal Medicine

## 2010-09-26 DIAGNOSIS — Z1231 Encounter for screening mammogram for malignant neoplasm of breast: Secondary | ICD-10-CM

## 2010-09-28 ENCOUNTER — Other Ambulatory Visit: Payer: Medicare Other | Admitting: Internal Medicine

## 2010-09-28 DIAGNOSIS — Z0000 Encounter for general adult medical examination without abnormal findings: Secondary | ICD-10-CM

## 2010-09-28 LAB — LIPID PANEL
Cholesterol: 193 mg/dL (ref 0–200)
HDL: 62.8 mg/dL (ref >39.00)
LDL Cholesterol: 109 mg/dL — ABNORMAL HIGH (ref 0–99)
Total CHOL/HDL Ratio: 3
Triglycerides: 104 mg/dL (ref 0.0–149.0)
VLDL: 20.8 mg/dL (ref 0.0–40.0)

## 2010-09-28 LAB — BASIC METABOLIC PANEL
BUN: 23 mg/dL (ref 6–23)
CO2: 29 mEq/L (ref 19–32)
Calcium: 9.7 mg/dL (ref 8.4–10.5)
Chloride: 105 mEq/L (ref 96–112)
Creatinine, Ser: 0.6 mg/dL (ref 0.4–1.2)
GFR: 105.43 mL/min (ref >60.00)
Glucose, Bld: 93 mg/dL (ref 70–99)
Potassium: 4.2 mEq/L (ref 3.5–5.1)
Sodium: 142 mEq/L (ref 135–145)

## 2010-09-28 LAB — HEPATIC FUNCTION PANEL
ALT: 17 U/L (ref 0–35)
AST: 17 U/L (ref 0–37)
Albumin: 4.2 g/dL (ref 3.5–5.2)
Alkaline Phosphatase: 41 U/L (ref 39–117)
Bilirubin, Direct: 0.1 mg/dL (ref 0.0–0.3)
Total Bilirubin: 0.8 mg/dL (ref 0.3–1.2)
Total Protein: 6.8 g/dL (ref 6.0–8.3)

## 2010-09-28 LAB — CBC WITH DIFFERENTIAL/PLATELET
Basophils Absolute: 0 10*3/uL (ref 0.0–0.1)
Basophils Relative: 0.6 % (ref 0.0–3.0)
Eosinophils Absolute: 0.1 10*3/uL (ref 0.0–0.7)
Eosinophils Relative: 2.7 % (ref 0.0–5.0)
HCT: 37.1 % (ref 36.0–46.0)
Hemoglobin: 12.8 g/dL (ref 12.0–15.0)
Lymphocytes Relative: 35.3 % (ref 12.0–46.0)
Lymphs Abs: 1.4 10*3/uL (ref 0.7–4.0)
MCHC: 34.6 g/dL (ref 30.0–36.0)
MCV: 91.4 fl (ref 78.0–100.0)
Monocytes Absolute: 0.3 10*3/uL (ref 0.1–1.0)
Monocytes Relative: 8.1 % (ref 3.0–12.0)
Neutro Abs: 2.2 10*3/uL (ref 1.4–7.7)
Neutrophils Relative %: 53.3 % (ref 43.0–77.0)
Platelets: 235 10*3/uL (ref 150.0–400.0)
RBC: 4.05 Mil/uL (ref 3.87–5.11)
RDW: 13.5 % (ref 11.5–14.6)
WBC: 4.1 10*3/uL — ABNORMAL LOW (ref 4.5–10.5)

## 2010-09-28 LAB — POCT URINALYSIS DIPSTICK
Bilirubin, UA: NEGATIVE
Glucose, UA: NEGATIVE
Ketones, UA: NEGATIVE
Leukocytes, UA: NEGATIVE
Nitrite, UA: NEGATIVE
Protein, UA: NEGATIVE
Spec Grav, UA: 1.02
Urobilinogen, UA: 0.2
pH, UA: 5

## 2010-09-28 LAB — TSH: TSH: 1.7 u[IU]/mL (ref 0.35–5.50)

## 2010-10-04 ENCOUNTER — Encounter: Payer: Self-pay | Admitting: Internal Medicine

## 2010-10-05 ENCOUNTER — Ambulatory Visit (INDEPENDENT_AMBULATORY_CARE_PROVIDER_SITE_OTHER): Payer: Medicare Other | Admitting: Internal Medicine

## 2010-10-05 ENCOUNTER — Encounter: Payer: Self-pay | Admitting: Internal Medicine

## 2010-10-05 VITALS — BP 130/80 | HR 72 | Temp 98.2°F | Resp 14 | Ht 62.0 in | Wt 130.0 lb

## 2010-10-05 DIAGNOSIS — E785 Hyperlipidemia, unspecified: Secondary | ICD-10-CM

## 2010-10-05 DIAGNOSIS — I10 Essential (primary) hypertension: Secondary | ICD-10-CM

## 2010-10-05 DIAGNOSIS — Z0000 Encounter for general adult medical examination without abnormal findings: Secondary | ICD-10-CM

## 2010-10-05 NOTE — Assessment & Plan Note (Signed)
She is at her current weight goal her cholesterol is improved in terms of the total cholesterol consistently since she began anti-hyperlipidemia medications however there is a slight bump in the LDL to 2 a slight decrease in activity we linked for the patient the relationship between her physical activity and her cholesterol

## 2010-10-05 NOTE — Progress Notes (Signed)
  Subjective:    Patient ID: Theresa Chambers, female    DOB: 06/02/1933, 77 y.o.   MRN: [id number]  HPI patient is a 77 year old white female who presents for her yearly wellness examination she is without complaints she is compliant with her hyperlipidemia medications and her hypertension medications she was treated for candidiasis of the skin and nails with success.   She denies any chest pain shortness of breath PND or orthopnea she exercises 3-4 times a week    Review of Systems  Constitutional: Negative for activity change, appetite change and fatigue.  HENT: Negative for ear pain, congestion, neck pain, postnasal drip and sinus pressure.   Eyes: Negative for redness and visual disturbance.  Respiratory: Negative for cough, shortness of breath and wheezing.   Gastrointestinal: Negative for abdominal pain and abdominal distention.  Genitourinary: Negative for dysuria, frequency and menstrual problem.  Musculoskeletal: Negative for myalgias, joint swelling and arthralgias.  Skin: Negative for rash and wound.  Neurological: Negative for dizziness, weakness and headaches.  Hematological: Negative for adenopathy. Does not bruise/bleed easily.  Psychiatric/Behavioral: Negative for sleep disturbance and decreased concentration.   Past Medical History  Diagnosis Date  . Hypertension   . Hyperlipidemia   . Osteopenia   . History of lumpectomy    Past Surgical History  Procedure Date  . Tonsillectomy   . Breast lumpectomy     reports that she has quit smoking. She does not have any smokeless tobacco history on file. She reports that she drinks alcohol. She reports that she does not use illicit drugs. family history includes Anuerysm in her mother and Hypertension in her father. Allergies  Allergen Reactions  . Codeine   . Penicillins        Objective:   Physical Exam  Constitutional: She is oriented to person, place, and time. She appears well-developed and  well-nourished. No distress.  HENT:  Head: Normocephalic and atraumatic.  Right Ear: External ear normal.  Left Ear: External ear normal.  Nose: Nose normal.  Mouth/Throat: Oropharynx is clear and moist.  Eyes: Conjunctivae and EOM are normal. Pupils are equal, round, and reactive to light.  Neck: Normal range of motion. Neck supple. No JVD present. No tracheal deviation present. No thyromegaly present.  Cardiovascular: Normal rate, regular rhythm, normal heart sounds and intact distal pulses.   No murmur heard. Pulmonary/Chest: Effort normal and breath sounds normal. She has no wheezes. She exhibits no tenderness.  Abdominal: Soft. Bowel sounds are normal.  Musculoskeletal: Normal range of motion. She exhibits no edema and no tenderness.  Lymphadenopathy:    She has no cervical adenopathy.  Neurological: She is alert and oriented to person, place, and time. She has normal reflexes. No cranial nerve deficit.  Skin: Skin is warm and dry. She is not diaphoretic.  Psychiatric: She has a normal mood and affect. Her behavior is normal.          Assessment & Plan:   This is a routine physical examination for this healthy  Female. Reviewed all health maintenance protocols including mammography colonoscopy bone density and reviewed appropriate screening labs. Her immunization history was reviewed as well as her current medications and allergies refills of her chronic medications were given and the plan for yearly health maintenance was discussed all orders and referrals were made as appropriate.

## 2010-10-05 NOTE — Assessment & Plan Note (Signed)
Stable on the combination of amlodipine and hydrochlorothiazide she has no edema of her lower extremities from the calcium channel blocker and she's tolerating it well excellent control of her blood pressure renal function is good we reviewed the medications and she is comfortable staying on his medications and we believe they are adequately controlled her blood pressure to goals

## 2010-10-18 ENCOUNTER — Ambulatory Visit
Admission: RE | Admit: 2010-10-18 | Discharge: 2010-10-18 | Disposition: A | Discharge status: Home / Self Care | Payer: 59 | Source: Ambulatory Visit | Attending: Internal Medicine | Admitting: Internal Medicine

## 2010-10-18 DIAGNOSIS — Z1231 Encounter for screening mammogram for malignant neoplasm of breast: Secondary | ICD-10-CM

## 2010-11-28 ENCOUNTER — Other Ambulatory Visit: Payer: Self-pay | Admitting: *Deleted

## 2010-11-28 MED ORDER — AMLODIPINE BESYLATE 5 MG PO TABS: 5.0000 mg | ORAL_TABLET | Freq: Every day | ORAL | Status: DC

## 2010-11-28 MED ORDER — HYDROCHLOROTHIAZIDE 12.5 MG PO TABS: 12.5000 mg | ORAL_TABLET | Freq: Every day | ORAL | Status: DC

## 2011-01-17 ENCOUNTER — Encounter: Payer: Self-pay | Admitting: Internal Medicine

## 2011-01-17 ENCOUNTER — Ambulatory Visit (INDEPENDENT_AMBULATORY_CARE_PROVIDER_SITE_OTHER): Payer: Medicare Other | Admitting: Internal Medicine

## 2011-01-17 VITALS — BP 124/78 | HR 91 | Temp 98.5°F | Wt 133.0 lb

## 2011-01-17 DIAGNOSIS — R109 Unspecified abdominal pain: Secondary | ICD-10-CM

## 2011-01-17 LAB — CBC WITH DIFFERENTIAL/PLATELET
Basophils Absolute: 0 10*3/uL (ref 0.0–0.1)
Basophils Relative: 0 % (ref 0.0–3.0)
Eosinophils Absolute: 0 10*3/uL (ref 0.0–0.7)
Hemoglobin: 13 g/dL (ref 12.0–15.0)
Lymphs Abs: 1 10*3/uL (ref 0.7–4.0)
MCHC: 34 g/dL (ref 30.0–36.0)
MCV: 92 fl (ref 78.0–100.0)
Monocytes Absolute: 1 10*3/uL (ref 0.1–1.0)
Neutro Abs: 8.6 10*3/uL — ABNORMAL HIGH (ref 1.4–7.7)
RBC: 4.15 Mil/uL (ref 3.87–5.11)
RDW: 13.1 % (ref 11.5–14.6)

## 2011-01-19 ENCOUNTER — Telehealth: Payer: Self-pay

## 2011-01-19 NOTE — Telephone Encounter (Signed)
Message copied by Beverely Low on Thu Jan 19, 2011  4:27 PM ------      Message from: Staci Righter      Created: Wed Jan 18, 2011  5:52 PM       Labs nl

## 2011-01-19 NOTE — Telephone Encounter (Signed)
Message copied by Beverely Low on Thu Jan 19, 2011  1:18 PM ------      Message from: Staci Righter      Created: Wed Jan 18, 2011  5:52 PM       Labs nl

## 2011-01-19 NOTE — Telephone Encounter (Signed)
Pt notified.  Pt c/o fever for the past 2 nights. Notes that fever is only at night and body is a "little achy." Pt will monitor temp tonight

## 2011-01-19 NOTE — Telephone Encounter (Signed)
Left message for pt to call back  °

## 2011-01-22 DIAGNOSIS — R109 Unspecified abdominal pain: Secondary | ICD-10-CM | POA: Insufficient documentation

## 2011-01-22 NOTE — Assessment & Plan Note (Signed)
Obtain CBC. Discussed plain abdominal radiograph and patient defers. Possible relationship to constipation with dietary changes recently. Attempt MiraLax. Present to the emergency department with increasing severe abdominal pain nausea vomiting fever chills.Followup if no improvement or worsening.

## 2011-01-22 NOTE — Progress Notes (Signed)
  Subjective:    Patient ID: Theresa Chambers, female    DOB: June 09, 1933, 77 y.o.   MRN: [id number]  HPI Patient presents to clinic for evaluation of abdominal pain. Notes 4 day history of right lower quadrant abdominal pain. States the pain began after a significant change in her diet including high protein ingestion of eggs and bacon. Subsequently developed constipation with hard stool. Has had no blood in her stool nausea vomiting fever or chills. No other exacerbating or alleviating factors. No other complaints.  Reviewed past medical history, medications and allergies  Review of Systems see history of present illness     Objective:   Physical Exam  Nursing note and vitals reviewed. Constitutional: She appears well-developed and well-nourished. No distress.  HENT:  Head: Normocephalic and atraumatic.  Eyes: Conjunctivae are normal. No scleral icterus.  Abdominal: Soft. Bowel sounds are normal. She exhibits no distension and no mass. There is no hepatosplenomegaly. There is tenderness in the right lower quadrant. There is no rigidity, no rebound, no guarding and no tenderness at McBurney's point. No hernia.  Neurological: She is alert.  Skin: Skin is warm and dry. She is not diaphoretic.  Psychiatric: She has a normal mood and affect.          Assessment & Plan:

## 2011-01-23 ENCOUNTER — Ambulatory Visit (INDEPENDENT_AMBULATORY_CARE_PROVIDER_SITE_OTHER): Payer: Medicare Other | Admitting: Internal Medicine

## 2011-01-23 ENCOUNTER — Telehealth: Payer: Self-pay

## 2011-01-23 ENCOUNTER — Encounter: Payer: Self-pay | Admitting: Internal Medicine

## 2011-01-23 VITALS — BP 120/76 | Temp 98.2°F | Wt 132.0 lb

## 2011-01-23 DIAGNOSIS — R350 Frequency of micturition: Secondary | ICD-10-CM

## 2011-01-23 DIAGNOSIS — R109 Unspecified abdominal pain: Secondary | ICD-10-CM

## 2011-01-23 LAB — POCT URINALYSIS DIPSTICK: Glucose, UA: NEGATIVE

## 2011-01-23 MED ORDER — LEVOFLOXACIN 500 MG PO TABS: 500.0000 mg | ORAL_TABLET | Freq: Every day | ORAL | Status: AC

## 2011-01-23 NOTE — Assessment & Plan Note (Signed)
Abdominal pain and tenderness significantly improved but not resolved. Has some mild change in urinary symptoms and urinalysis suggestive of UTI. However with history of diverticulosis on colonoscopy cannot exclude diverticulitis. Recommend empiric Levaquin 500 mg a day for seven days. Followup closely if no improvement or worsening over the next several days. I discussed possible abdominal CT if does not respond to a antibiotic therapy.

## 2011-01-23 NOTE — Progress Notes (Signed)
  Subjective:    Patient ID: Theresa Chambers, female    DOB: 09/10/1932, 77 y.o.   MRN: [id number]  HPI Pt presents to clinic for evaluation of fever. Seen approximately 6 days ago. At that time four day history of right lower quadrant abdominal pain which she related to constipation after significant change in her diet. CBC normal at that time. She treated her constipation with subsequent return to normal bowel movements. Also notes long time had significant improvement of her right upper quadrant pain which she states is 99% improved. Also approximately 6 days ago began to have intermittent fevers predominantly in the afternoon with a maximum temperature of 101.7. They have occurred daily. Only other associated symptoms if she were to have her call is any increase in nocturia and urinary urgency at the time. Denies dysuria or hematuria. Urinalysis obtained and reviewed the patient demonstrates +2 blood +1 protein and trace le. Colonoscopy dated June 2008 reviewed the finding of diverticulosis left greater than right. No other exacerbating or alleviating factors. Taking no medication for the problem. Denies other complaints.    Review of Systems see history of present illness     Objective:   Physical Exam  Nursing note and vitals reviewed. Constitutional: She appears well-developed and well-nourished. No distress.  HENT:  Head: Normocephalic and atraumatic.  Eyes: Conjunctivae are normal. No scleral icterus.  Abdominal: Soft. Bowel sounds are normal. She exhibits no distension and no mass. There is tenderness. There is no rebound and no guarding.       Minimal tenderness right lower quadrant. Significant improvement from last visit  Neurological: She is alert.  Skin: Skin is warm and dry. She is not diaphoretic.  Psychiatric: She has a normal mood and affect.          Assessment & Plan:

## 2011-01-23 NOTE — Telephone Encounter (Signed)
Pt left message stating she is still running a fever in the afternoons and evenings. Notes that the fever was 101.7 yesterday around 2pm. She also stated that she now has a cough.  Attempted to return pt call to assess other sxs. Left message for pt to call back for triage or to schedule an appointment.

## 2011-03-01 ENCOUNTER — Telehealth: Payer: Self-pay | Admitting: Internal Medicine

## 2011-03-01 MED ORDER — AMLODIPINE BESYLATE 5 MG PO TABS: 5.0000 mg | ORAL_TABLET | Freq: Every day | ORAL | Status: DC

## 2011-03-01 MED ORDER — ALPRAZOLAM 0.25 MG PO TABS: 0.2500 mg | ORAL_TABLET | Freq: Three times a day (TID) | ORAL | Status: DC | PRN

## 2011-03-01 MED ORDER — CIPROFLOXACIN HCL 500 MG PO TABS: 500.0000 mg | ORAL_TABLET | Freq: Two times a day (BID) | ORAL | Status: AC | PRN

## 2011-03-01 MED ORDER — PROMETHAZINE HCL 25 MG PO TABS: 25.0000 mg | ORAL_TABLET | Freq: Four times a day (QID) | ORAL | Status: DC | PRN

## 2011-03-01 MED ORDER — DIPHENOXYLATE-ATROPINE 2.5-0.025 MG PO TABS: 1.0000 | ORAL_TABLET | Freq: Four times a day (QID) | ORAL | Status: AC | PRN

## 2011-03-01 MED ORDER — HYDROCHLOROTHIAZIDE 12.5 MG PO TABS: 12.5000 mg | ORAL_TABLET | Freq: Every day | ORAL | Status: DC

## 2011-03-01 NOTE — Telephone Encounter (Signed)
Pt called req refill of generic for Lomotil 15 mg for loose stools, promethazine (PHENERGAN) 25 MG tablet, Ciprofloxacin 500 mg, Alprazolam .25 mg. Pt also needs a 30 day supply of HCTZ 12.5 and the Norvasc 5 mg 30 day supply.  Pls call in to [facility] on [address]. Pt is taking a trip in 3 wks to [place]. Also pt needs to know if she needs any immunizations for trip and also pt may need to get a zpak to take on trip, as suggested by Dr Londell Moh.

## 2011-03-09 ENCOUNTER — Telehealth: Payer: Self-pay | Admitting: Internal Medicine

## 2011-03-09 NOTE — Telephone Encounter (Signed)
Left message on machine PER TRAVEL MEDICINE MAY TAKE TDAP AS LONG IT HAS BEEN A YEAR-WILL CALL AND SCHEDULE

## 2011-03-09 NOTE — Telephone Encounter (Signed)
Pt is going to [place] and wanted to know if she should get a shot for wooping cough? please contact pt.

## 2011-03-10 ENCOUNTER — Ambulatory Visit (INDEPENDENT_AMBULATORY_CARE_PROVIDER_SITE_OTHER): Payer: 59 | Admitting: Internal Medicine

## 2011-03-10 DIAGNOSIS — Z0000 Encounter for general adult medical examination without abnormal findings: Secondary | ICD-10-CM

## 2011-03-10 DIAGNOSIS — Z23 Encounter for immunization: Secondary | ICD-10-CM

## 2011-04-19 ENCOUNTER — Ambulatory Visit (INDEPENDENT_AMBULATORY_CARE_PROVIDER_SITE_OTHER): Payer: 59 | Admitting: Internal Medicine

## 2011-04-19 ENCOUNTER — Encounter: Payer: Self-pay | Admitting: Internal Medicine

## 2011-04-19 VITALS — BP 124/80 | HR 72 | Temp 98.2°F | Resp 16 | Ht 62.0 in | Wt 134.0 lb

## 2011-04-19 DIAGNOSIS — Z23 Encounter for immunization: Secondary | ICD-10-CM

## 2011-04-19 DIAGNOSIS — E785 Hyperlipidemia, unspecified: Secondary | ICD-10-CM

## 2011-04-19 DIAGNOSIS — Z0000 Encounter for general adult medical examination without abnormal findings: Secondary | ICD-10-CM

## 2011-04-19 DIAGNOSIS — I10 Essential (primary) hypertension: Secondary | ICD-10-CM

## 2011-04-19 NOTE — Progress Notes (Signed)
  Subjective:    Patient ID: Theresa Chambers, female    DOB: 1933-02-17, 77 y.o.   MRN: [id number]  HPI The patient is a 77 year old female in excellent health with hypertension hyperlipidemia and osteopenia she is on amlodipine and hydrochlorothiazide with good control of her blood pressure.  Her weight is stable she is stable on all medications that she is due for physical in February we will defer her blood work to February  Review of Systems  Constitutional: Negative for activity change, appetite change and fatigue.  HENT: Negative for ear pain, congestion, neck pain, postnasal drip and sinus pressure.   Eyes: Negative for redness and visual disturbance.  Respiratory: Negative for cough, shortness of breath and wheezing.   Gastrointestinal: Negative for abdominal pain and abdominal distention.  Genitourinary: Negative for dysuria, frequency and menstrual problem.  Musculoskeletal: Negative for myalgias, joint swelling and arthralgias.  Skin: Negative for rash and wound.  Neurological: Negative for dizziness, weakness and headaches.  Hematological: Negative for adenopathy. Does not bruise/bleed easily.  Psychiatric/Behavioral: Negative for sleep disturbance and decreased concentration.       Objective:   Physical Exam  Vitals reviewed. Constitutional: She is oriented to person, place, and time. She appears well-developed and well-nourished. No distress.  HENT:  Head: Normocephalic and atraumatic.  Right Ear: External ear normal.  Left Ear: External ear normal.  Nose: Nose normal.  Mouth/Throat: Oropharynx is clear and moist.  Eyes: Conjunctivae and EOM are normal. Pupils are equal, round, and reactive to light.  Neck: Normal range of motion. Neck supple. No JVD present. No tracheal deviation present. No thyromegaly present.  Cardiovascular: Normal rate, regular rhythm, normal heart sounds and intact distal pulses.   No murmur heard. Pulmonary/Chest: Effort normal and  breath sounds normal. She has no wheezes. She exhibits no tenderness.  Abdominal: Soft. Bowel sounds are normal.  Musculoskeletal: Normal range of motion. She exhibits no edema and no tenderness.  Lymphadenopathy:    She has no cervical adenopathy.  Neurological: She is alert and oriented to person, place, and time. She has normal reflexes. No cranial nerve deficit.  Skin: Skin is warm and dry. She is not diaphoretic.  Psychiatric: She has a normal mood and affect. Her behavior is normal.          Assessment & Plan:  Patient's blood pressure stable on 2 drugs she has no peripheral edema and no signs of heart failure she is stable her current medications recommend continuing hydrochlorothiazide and amlodipine all of the medications reviewed with the patient including her aspirin a day her visual capsules and her red right sheath for control of her other risk factor which is hyperlipidemia.  Re emphasis on continued exercise and mobility is the best thing to promote longevity

## 2011-04-19 NOTE — Patient Instructions (Signed)
cpx in feb

## 2011-07-06 ENCOUNTER — Other Ambulatory Visit: Payer: Self-pay | Admitting: Internal Medicine

## 2011-07-13 ENCOUNTER — Other Ambulatory Visit: Payer: Self-pay | Admitting: Internal Medicine

## 2011-10-02 ENCOUNTER — Other Ambulatory Visit (INDEPENDENT_AMBULATORY_CARE_PROVIDER_SITE_OTHER): Payer: Medicare Other

## 2011-10-02 DIAGNOSIS — Z0000 Encounter for general adult medical examination without abnormal findings: Secondary | ICD-10-CM | POA: Diagnosis not present

## 2011-10-02 DIAGNOSIS — E785 Hyperlipidemia, unspecified: Secondary | ICD-10-CM | POA: Diagnosis not present

## 2011-10-02 DIAGNOSIS — I10 Essential (primary) hypertension: Secondary | ICD-10-CM | POA: Diagnosis not present

## 2011-10-02 LAB — LDL CHOLESTEROL, DIRECT: Direct LDL: 132.7 mg/dL

## 2011-10-02 LAB — POCT URINALYSIS DIPSTICK
Bilirubin, UA: NEGATIVE
Ketones, UA: NEGATIVE
Nitrite, UA: NEGATIVE
Protein, UA: NEGATIVE

## 2011-10-02 LAB — LIPID PANEL
Total CHOL/HDL Ratio: 3
VLDL: 24.8 mg/dL (ref 0.0–40.0)

## 2011-10-02 LAB — TSH: TSH: 1.96 u[IU]/mL (ref 0.35–5.50)

## 2011-10-02 LAB — CBC WITH DIFFERENTIAL/PLATELET
Basophils Absolute: 0 10*3/uL (ref 0.0–0.1)
Eosinophils Absolute: 0.2 10*3/uL (ref 0.0–0.7)
Hemoglobin: 12.9 g/dL (ref 12.0–15.0)
Lymphocytes Relative: 28.3 % (ref 12.0–46.0)
Monocytes Relative: 7.8 % (ref 3.0–12.0)
Neutro Abs: 2.6 10*3/uL (ref 1.4–7.7)
Neutrophils Relative %: 58.7 % (ref 43.0–77.0)
RDW: 13.4 % (ref 11.5–14.6)

## 2011-10-02 LAB — BASIC METABOLIC PANEL
Chloride: 104 mEq/L (ref 96–112)
Creatinine, Ser: 0.6 mg/dL (ref 0.4–1.2)
GFR: 95.73 mL/min (ref >60.00)
Potassium: 3.8 mEq/L (ref 3.5–5.1)

## 2011-10-02 LAB — HEPATIC FUNCTION PANEL
AST: 16 U/L (ref 0–37)
Alkaline Phosphatase: 45 U/L (ref 39–117)
Total Bilirubin: 0.5 mg/dL (ref 0.3–1.2)

## 2011-10-03 LAB — VITAMIN D 25 HYDROXY (VIT D DEFICIENCY, FRACTURES): Vit D, 25-Hydroxy: 50 ng/mL (ref 30–89)

## 2011-10-09 ENCOUNTER — Ambulatory Visit (INDEPENDENT_AMBULATORY_CARE_PROVIDER_SITE_OTHER): Payer: Medicare Other | Admitting: Internal Medicine

## 2011-10-09 ENCOUNTER — Encounter: Payer: Self-pay | Admitting: Internal Medicine

## 2011-10-09 VITALS — BP 154/90 | HR 80 | Temp 98.6°F | Resp 16 | Ht 62.0 in | Wt 130.0 lb

## 2011-10-09 DIAGNOSIS — I10 Essential (primary) hypertension: Secondary | ICD-10-CM

## 2011-10-09 DIAGNOSIS — E785 Hyperlipidemia, unspecified: Secondary | ICD-10-CM

## 2011-10-09 DIAGNOSIS — Z0000 Encounter for general adult medical examination without abnormal findings: Secondary | ICD-10-CM

## 2011-10-09 DIAGNOSIS — T887XXA Unspecified adverse effect of drug or medicament, initial encounter: Secondary | ICD-10-CM

## 2011-10-09 DIAGNOSIS — N390 Urinary tract infection, site not specified: Secondary | ICD-10-CM

## 2011-10-09 MED ORDER — OMEGA-3 FATTY ACIDS 1000 MG PO CAPS: 1.0000 g | ORAL_CAPSULE | Freq: Two times a day (BID) | ORAL | Status: DC

## 2011-10-09 MED ORDER — CIPROFLOXACIN HCL 250 MG PO TABS: 250.0000 mg | ORAL_TABLET | Freq: Two times a day (BID) | ORAL | Status: AC

## 2011-10-09 NOTE — Progress Notes (Signed)
Subjective:    Patient ID: Theresa Chambers, female    DOB: Feb 26, 1933, 78 y.o.   MRN: [id number]  HPI  Pt presents for follow up. Has noted increased anxiety. Blood pressure is higher with the anxiety. Follow up for lipids   Review of Systems  Constitutional: Negative for activity change, appetite change and fatigue.  HENT: Negative for ear pain, congestion, neck pain, postnasal drip and sinus pressure.   Eyes: Negative for redness and visual disturbance.  Respiratory: Negative for cough, shortness of breath and wheezing.   Gastrointestinal: Negative for abdominal pain and abdominal distention.  Genitourinary: Negative for dysuria, frequency and menstrual problem.  Musculoskeletal: Negative for myalgias, joint swelling and arthralgias.  Skin: Negative for rash and wound.  Neurological: Negative for dizziness, weakness and headaches.  Hematological: Negative for adenopathy. Does not bruise/bleed easily.  Psychiatric/Behavioral: Negative for sleep disturbance and decreased concentration.   Past Medical History  Diagnosis Date  . Hypertension   . Hyperlipidemia   . Osteopenia   . History of lumpectomy     History   Social History  . Marital Status: Married    Spouse Name: N/A    Number of Children: N/A  . Years of Education: N/A   Occupational History  . Not on file.   Social History Main Topics  . Smoking status: Former Games developer  . Smokeless tobacco: Not on file   Comment: quit in 1975  . Alcohol Use: Yes  . Drug Use: No  . Sexually Active: Not Currently   Other Topics Concern  . Not on file   Social History Narrative  . No narrative on file    Past Surgical History  Procedure Date  . Tonsillectomy   . Breast lumpectomy     Family History  Problem Relation Age of Onset  . Anuerysm Mother   . Hypertension Father     Allergies  Allergen Reactions  . Codeine   . Penicillins     Current Outpatient Prescriptions on File Prior to Visit    Medication Sig Dispense Refill  . ALPRAZolam (XANAX) 0.25 MG tablet Take 0.25 mg by mouth. 1-2 every 4-6 hrs as needed       . amLODipine (NORVASC) 5 MG tablet take 1 tablet by mouth once daily  30 tablet  3  . aspirin 81 MG tablet Take 81 mg by mouth daily.        . Calcium Carbonate (CALTRATE 600 PO) Take by mouth daily.        . diphenoxylate-atropine (LOMOTIL) 2.5-0.025 MG per tablet Take 1 tablet by mouth 4 (four) times daily as needed for diarrhea/loose stools.  30 tablet  1  . fish oil-omega-3 fatty acids 1000 MG capsule Take 2 g by mouth 2 (two) times daily.        . hydrochlorothiazide (MICROZIDE) 12.5 MG capsule take 1 tablet by mouth once daily  30 capsule  3  . Multiple Vitamin (MULTIVITAMIN) tablet Take 1 tablet by mouth daily.        . promethazine (PHENERGAN) 25 MG tablet Take 1 tablet (25 mg total) by mouth every 6 (six) hours as needed.  30 tablet  0  . Red Yeast Rice 600 MG CAPS Take by mouth daily.          BP 154/90  Pulse 80  Temp 98.6 F (37 C)  Resp 16  Ht 5\' 2"  (1.575 m)  Wt 130 lb (58.968 kg)  BMI 23.78 kg/m2  Objective:   Physical Exam  Nursing note and vitals reviewed. Constitutional: She is oriented to person, place, and time. She appears well-developed and well-nourished. No distress.  HENT:  Head: Normocephalic and atraumatic.  Right Ear: External ear normal.  Left Ear: External ear normal.  Nose: Nose normal.  Mouth/Throat: Oropharynx is clear and moist.  Eyes: Conjunctivae and EOM are normal. Pupils are equal, round, and reactive to light.  Neck: Normal range of motion. Neck supple. No JVD present. No tracheal deviation present. No thyromegaly present.  Cardiovascular: Normal rate, regular rhythm, normal heart sounds and intact distal pulses.   No murmur heard. Pulmonary/Chest: Effort normal and breath sounds normal. She has no wheezes. She exhibits no tenderness.  Abdominal: Soft. Bowel sounds are normal.  Musculoskeletal: Normal range  of motion. She exhibits no edema and no tenderness.  Lymphadenopathy:    She has no cervical adenopathy.  Neurological: She is alert and oriented to person, place, and time. She has normal reflexes. No cranial nerve deficit.  Skin: Skin is warm and dry. She is not diaphoretic.  Psychiatric: She has a normal mood and affect. Her behavior is normal.          Assessment & Plan:  Theresa Chambers is moderately elevated possibly due to anxiety we will monitor her blood pressure carefully and make sure that this does not indicate a rise in blood pressure.  I suspect with moderate anxiety is more likely to be hypoglycemia she's given up concentrated sweets. Passed clock test Possible anxiety? Subjective:    Theresa Chambers is a 78 y.o. female who presents for Medicare Annual/Subsequent preventive examination.  Preventive Screening-Counseling & Management  Tobacco History  Smoking status  . Former Smoker  Smokeless tobacco  . Not on file  Comment: quit in 1975     Problems Prior to Visit 1.   Current Problems (verified) Patient Active Problem List  Diagnoses  . CANDIDIASIS OF SKIN AND NAILS  . HYPERLIPIDEMIA  . HYPERTENSION  . UNS ADVRS EFF UNS RX MEDICINAL&BIOLOGICAL SBSTNC  . Abdominal  pain, other specified site    Medications Prior to Visit Current Outpatient Prescriptions on File Prior to Visit  Medication Sig Dispense Refill  . ALPRAZolam (XANAX) 0.25 MG tablet Take 0.25 mg by mouth. 1-2 every 4-6 hrs as needed       . amLODipine (NORVASC) 5 MG tablet take 1 tablet by mouth once daily  30 tablet  3  . aspirin 81 MG tablet Take 81 mg by mouth daily.        . Calcium Carbonate (CALTRATE 600 PO) Take by mouth daily.        . diphenoxylate-atropine (LOMOTIL) 2.5-0.025 MG per tablet Take 1 tablet by mouth 4 (four) times daily as needed for diarrhea/loose stools.  30 tablet  1  . fish oil-omega-3 fatty acids 1000 MG capsule Take 2 g by mouth 2 (two) times daily.        .  hydrochlorothiazide (MICROZIDE) 12.5 MG capsule take 1 tablet by mouth once daily  30 capsule  3  . Multiple Vitamin (MULTIVITAMIN) tablet Take 1 tablet by mouth daily.        . promethazine (PHENERGAN) 25 MG tablet Take 1 tablet (25 mg total) by mouth every 6 (six) hours as needed.  30 tablet  0  . Red Yeast Rice 600 MG CAPS Take by mouth daily.          Current Medications (verified) Current Outpatient Prescriptions  Medication  Sig Dispense Refill  . ALPRAZolam (XANAX) 0.25 MG tablet Take 0.25 mg by mouth. 1-2 every 4-6 hrs as needed       . amLODipine (NORVASC) 5 MG tablet take 1 tablet by mouth once daily  30 tablet  3  . aspirin 81 MG tablet Take 81 mg by mouth daily.        . Calcium Carbonate (CALTRATE 600 PO) Take by mouth daily.        . diphenoxylate-atropine (LOMOTIL) 2.5-0.025 MG per tablet Take 1 tablet by mouth 4 (four) times daily as needed for diarrhea/loose stools.  30 tablet  1  . fish oil-omega-3 fatty acids 1000 MG capsule Take 2 g by mouth 2 (two) times daily.        . hydrochlorothiazide (MICROZIDE) 12.5 MG capsule take 1 tablet by mouth once daily  30 capsule  3  . Multiple Vitamin (MULTIVITAMIN) tablet Take 1 tablet by mouth daily.        . promethazine (PHENERGAN) 25 MG tablet Take 1 tablet (25 mg total) by mouth every 6 (six) hours as needed.  30 tablet  0  . Red Yeast Rice 600 MG CAPS Take by mouth daily.           Allergies (verified) Codeine and Penicillins   PAST HISTORY  Family History Family History  Problem Relation Age of Onset  . Anuerysm Mother   . Hypertension Father     Social History History  Substance Use Topics  . Smoking status: Former Games developer  . Smokeless tobacco: Not on file   Comment: quit in 1975  . Alcohol Use: Yes     Are there smokers in your home (other than you)? No  Risk Factors Current exercise habits: The patient does not participate in regular exercise at present.  Dietary issues discussed: on restricted sweets diet     Cardiac risk factors: advanced age (older than 23 for men, 39 for women), hypertension and sedentary lifestyle.  Depression Screen (Note: if answer to either of the following is "Yes", a more complete depression screening is indicated)   Over the past two weeks, have you felt down, depressed or hopeless? No  Over the past two weeks, have you felt little interest or pleasure in doing things? No  Have you lost interest or pleasure in daily life? No  Do you often feel hopeless? No  Do you cry easily over simple problems? No  Activities of Daily Living In your present state of health, do you have any difficulty performing the following activities?:  Driving? No Managing money?  No Feeding yourself? No Getting from bed to chair? No Climbing a flight of stairs? No Preparing food and eating?: No Bathing or showering? No Getting dressed: No Getting to the toilet? No Using the toilet:No Moving around from place to place: No In the past year have you fallen or had a near fall?:No   Are you sexually active?  No  Do you have more than one partner?  No  Hearing Difficulties: No Do you often ask people to speak up or repeat themselves? No Do you experience ringing or noises in your ears? No Do you have difficulty understanding soft or whispered voices? No   Do you feel that you have a problem with memory? No  Do you often misplace items? No  Do you feel safe at home?  No  Cognitive Testing  Alert? Yes  Normal Appearance?Yes  Oriented to person? Yes  Place? Yes  Time? Yes  Recall of three objects?  Yes  Can perform simple calculations? Yes  Displays appropriate judgment?Yes  Can read the correct time from a watch face?Yes   Advanced Directives have been discussed with the patient? Yes  List the Names of Other Physician/Practitioners you currently use: 1.    Indicate any recent Medical Services you may have received from other than [facility] providers in the past year (date may be  approximate).  Immunization History  Administered Date(s) Administered  . Influenza Split 04/19/2011  . Influenza Whole 06/12/2007, 05/24/2008, 05/19/2009, 03/24/2010  . Pneumococcal Polysaccharide 05/24/2006  . Td 07/24/1997, 08/29/2007  . Tdap 03/10/2011  . Zoster 07/24/2006    Screening Tests Health Maintenance  Topic Date Due  . Colonoscopy  02/05/1985  . Influenza Vaccine  04/23/2012  . Tetanus/tdap  03/09/2021  . Pneumococcal Polysaccharide Vaccine Age 72 And Over  Completed  . Zostavax  Completed    All answers were reviewed with the patient and necessary referrals were made:  Carrie Mew, MD   10/09/2011   History reviewed: allergies, current medications, past family history, past medical history, past social history, past surgical history and problem list  Review of Systems A comprehensive review of systems was negative.    Objective:     Vision by Snellen chart: right eye:20/20, left eye:20/20  Body mass index is 23.78 kg/(m^2). BP 154/90  Pulse 80  Temp 98.6 F (37 C)  Resp 16  Ht 5\' 2"  (1.575 m)  Wt 130 lb (58.968 kg)  BMI 23.78 kg/m2  BP 154/90  Pulse 80  Temp 98.6 F (37 C)  Resp 16  Ht 5\' 2"  (1.575 m)  Wt 130 lb (58.968 kg)  BMI 23.78 kg/m2  General Appearance:    Alert, cooperative, no distress, appears stated age  Head:    Normocephalic, without obvious abnormality, atraumatic  Eyes:    PERRL, conjunctiva/corneas clear, EOM's intact, fundi    benign, both eyes  Ears:    Normal TM's and external ear canals, both ears  Nose:   Nares normal, septum midline, mucosa normal, no drainage    or sinus tenderness  Throat:   Lips, mucosa, and tongue normal; teeth and gums normal  Neck:   Supple, symmetrical, trachea midline, no adenopathy;    thyroid:  no enlargement/tenderness/nodules; no carotid   bruit or JVD  Back:     Symmetric, no curvature, ROM normal, no CVA tenderness  Lungs:     Clear to auscultation bilaterally, respirations  unlabored  Chest Wall:    No tenderness or deformity   Heart:    Regular rate and rhythm, S1 and S2 normal, no murmur, rub   or gallop  Breast Exam:    No tenderness, masses, or nipple abnormality  Abdomen:     Soft, non-tender, bowel sounds active all four quadrants,    no masses, no organomegaly  Genitalia:    Normal female without lesion, discharge or tenderness  Rectal:    Normal tone, normal prostate, no masses or tenderness;   guaiac negative stool  Extremities:   Extremities normal, atraumatic, no cyanosis or edema  Pulses:   2+ and symmetric all extremities  Skin:   Skin color, texture, turgor normal, no rashes or lesions  Lymph nodes:   Cervical, supraclavicular, and axillary nodes normal  Neurologic:   CNII-XII intact, normal strength, sensation and reflexes    throughout       Assessment:     This is a routine  physical examination for this healthy  Female. Reviewed all health maintenance protocols including mammography colonoscopy bone density and reviewed appropriate screening labs. Her immunization history was reviewed as well as her current medications and allergies refills of her chronic medications were given and the plan for yearly health maintenance was discussed all orders and referrals were made as appropriate.      Plan:     During the course of the visit the patient was educated and counseled about appropriate screening and preventive services including:    Influenza vaccine  Td vaccine  Diabetes screening  Diet review for nutrition referral? Yes ____  Not Indicated _x___   Patient Instructions (the written plan) was given to the patient.  Medicare Attestation I have personally reviewed: The patient's medical and social history Their use of alcohol, tobacco or illicit drugs Their current medications and supplements The patient's functional ability including ADLs,fall risks, home safety risks, cognitive, and hearing and visual impairment Diet and  physical activities Evidence for depression or mood disorders  The patient's weight, height, BMI, and visual acuity have been recorded in the chart.  I have made referrals, counseling, and provided education to the patient based on review of the above and I have provided the patient with a written personalized care plan for preventive services.     Carrie Mew, MD   10/09/2011

## 2011-10-09 NOTE — Patient Instructions (Signed)
The patient is instructed to continue all medications as prescribed. Schedule followup with check out clerk upon leaving the clinic  

## 2011-11-04 ENCOUNTER — Other Ambulatory Visit: Payer: Self-pay | Admitting: Internal Medicine

## 2011-11-21 ENCOUNTER — Other Ambulatory Visit: Payer: Self-pay | Admitting: Internal Medicine

## 2011-11-30 ENCOUNTER — Other Ambulatory Visit: Payer: Self-pay | Admitting: Internal Medicine

## 2011-11-30 DIAGNOSIS — Z1231 Encounter for screening mammogram for malignant neoplasm of breast: Secondary | ICD-10-CM

## 2011-12-20 DIAGNOSIS — H40019 Open angle with borderline findings, low risk, unspecified eye: Secondary | ICD-10-CM | POA: Diagnosis not present

## 2011-12-20 DIAGNOSIS — H2510 Age-related nuclear cataract, unspecified eye: Secondary | ICD-10-CM | POA: Diagnosis not present

## 2011-12-26 ENCOUNTER — Ambulatory Visit
Admission: RE | Admit: 2011-12-26 | Discharge: 2011-12-26 | Disposition: A | Discharge status: Home / Self Care | Payer: Medicare Other | Source: Ambulatory Visit | Attending: Internal Medicine | Admitting: Internal Medicine

## 2011-12-26 DIAGNOSIS — Z1231 Encounter for screening mammogram for malignant neoplasm of breast: Secondary | ICD-10-CM | POA: Diagnosis not present

## 2012-03-07 ENCOUNTER — Other Ambulatory Visit: Payer: Self-pay | Admitting: Internal Medicine

## 2012-03-20 ENCOUNTER — Other Ambulatory Visit: Payer: Self-pay | Admitting: Internal Medicine

## 2012-04-10 ENCOUNTER — Encounter: Payer: Self-pay | Admitting: Internal Medicine

## 2012-04-10 ENCOUNTER — Ambulatory Visit (INDEPENDENT_AMBULATORY_CARE_PROVIDER_SITE_OTHER): Payer: Medicare Other | Admitting: Internal Medicine

## 2012-04-10 VITALS — BP 156/80 | HR 72 | Temp 98.2°F | Resp 16 | Ht 62.0 in | Wt 135.0 lb

## 2012-04-10 DIAGNOSIS — T887XXA Unspecified adverse effect of drug or medicament, initial encounter: Secondary | ICD-10-CM

## 2012-04-10 DIAGNOSIS — E785 Hyperlipidemia, unspecified: Secondary | ICD-10-CM | POA: Diagnosis not present

## 2012-04-10 DIAGNOSIS — Z23 Encounter for immunization: Secondary | ICD-10-CM

## 2012-04-10 DIAGNOSIS — I10 Essential (primary) hypertension: Secondary | ICD-10-CM

## 2012-04-10 LAB — LIPID PANEL
Cholesterol: 239 mg/dL — ABNORMAL HIGH (ref 0–200)
Triglycerides: 156 mg/dL — ABNORMAL HIGH (ref 0.0–149.0)

## 2012-04-10 MED ORDER — LISINOPRIL-HYDROCHLOROTHIAZIDE 10-12.5 MG PO TABS: 1.0000 | ORAL_TABLET | Freq: Every day | ORAL | Status: DC

## 2012-04-10 MED ORDER — RED YEAST RICE 600 MG PO CAPS: 1.0000 | ORAL_CAPSULE | Freq: Two times a day (BID) | ORAL | Status: DC

## 2012-04-10 MED ORDER — ALPRAZOLAM 0.25 MG PO TABS: 0.2500 mg | ORAL_TABLET | Freq: Every evening | ORAL | Status: DC | PRN

## 2012-04-10 NOTE — Progress Notes (Signed)
Subjective:    Patient ID: Theresa Chambers, female    DOB: March 30, 1933, 78 y.o.   MRN: [id number]  HPI Increased blood pressure Weight gain noted Episodic edema noted Lipid management  With increased dose of red rice yeast   Review of Systems  Constitutional: Negative for activity change, appetite change and fatigue.  HENT: Negative for ear pain, congestion, neck pain, postnasal drip and sinus pressure.   Eyes: Negative for redness and visual disturbance.  Respiratory: Negative for cough, shortness of breath and wheezing.   Gastrointestinal: Negative for abdominal pain and abdominal distention.  Genitourinary: Negative for dysuria, frequency and menstrual problem.  Musculoskeletal: Negative for myalgias, joint swelling and arthralgias.  Skin: Negative for rash and wound.  Neurological: Negative for dizziness, weakness and headaches.  Hematological: Negative for adenopathy. Does not bruise/bleed easily.  Psychiatric/Behavioral: Negative for disturbed wake/sleep cycle and decreased concentration.   Past Medical History  Diagnosis Date  . Hypertension   . Hyperlipidemia   . Osteopenia   . History of lumpectomy     History   Social History  . Marital Status: Married    Spouse Name: N/A    Number of Children: N/A  . Years of Education: N/A   Occupational History  . Not on file.   Social History Main Topics  . Smoking status: Former Games developer  . Smokeless tobacco: Not on file   Comment: quit in 1975  . Alcohol Use: Yes  . Drug Use: No  . Sexually Active: Not Currently   Other Topics Concern  . Not on file   Social History Narrative  . No narrative on file    Past Surgical History  Procedure Date  . Tonsillectomy   . Breast lumpectomy     Family History  Problem Relation Age of Onset  . Anuerysm Mother   . Hypertension Father     Allergies  Allergen Reactions  . Codeine   . Penicillins     Current Outpatient Prescriptions on File Prior to Visit    Medication Sig Dispense Refill  . amLODipine (NORVASC) 5 MG tablet take 1 tablet by mouth once daily  30 tablet  3  . aspirin 81 MG tablet Take 81 mg by mouth daily.        . Calcium Carbonate (CALTRATE 600 PO) Take by mouth daily.        . fish oil-omega-3 fatty acids 1000 MG capsule Take 1 capsule (1 g total) by mouth 2 (two) times daily.      . Multiple Vitamin (MULTIVITAMIN) tablet Take 1 tablet by mouth daily.        Marland Kitchen lisinopril-hydrochlorothiazide (PRINZIDE,ZESTORETIC) 10-12.5 MG per tablet Take 1 tablet by mouth daily.  30 tablet  5    BP 156/80  Pulse 72  Temp 98.2 F (36.8 C)  Resp 16  Ht 5\' 2"  (1.575 m)  Wt 135 lb (61.236 kg)  BMI 24.69 kg/m2       Objective:   Physical Exam  Nursing note and vitals reviewed. Constitutional: She is oriented to person, place, and time. She appears well-developed and well-nourished. No distress.  HENT:  Head: Normocephalic and atraumatic.  Right Ear: External ear normal.  Left Ear: External ear normal.  Nose: Nose normal.  Mouth/Throat: Oropharynx is clear and moist.  Eyes: Conjunctivae normal and EOM are normal. Pupils are equal, round, and reactive to light.  Neck: Normal range of motion. Neck supple. No JVD present. No tracheal deviation present. No thyromegaly present.  Cardiovascular: Normal rate, regular rhythm, normal heart sounds and intact distal pulses.   No murmur heard. Pulmonary/Chest: Effort normal and breath sounds normal. She has no wheezes. She exhibits no tenderness.  Abdominal: Soft. Bowel sounds are normal.  Musculoskeletal: Normal range of motion. She exhibits no edema and no tenderness.  Lymphadenopathy:    She has no cervical adenopathy.  Neurological: She is alert and oriented to person, place, and time. She has normal reflexes. No cranial nerve deficit.  Skin: Skin is warm and dry. She is not diaphoretic.  Psychiatric: She has a normal mood and affect. Her behavior is normal.          Assessment &  Plan:  His blood pressure was moderately elevated recheck her blood pressure further blood pressure was indeed in the 150s over 80 she's had episodic edema with traveling which would suggest the blood pressure elevation is most probably related to dietary changes and weight gain approximately 5 pounds.  We discussed diet exercise salt restriction and weight loss of strategies but in the interim because of the elevation of blood pressure the symptom of feet swelling we will change her blood pressure medications from hydrochlorothiazide and Norvasc to  Benazepril/ hctz and Norvasc

## 2012-04-10 NOTE — Patient Instructions (Addendum)
The patient is instructed to continue all medications as prescribed. Schedule followup with check out clerk upon leaving the clinic The hydrochlorothiazide is replaced by a combination  Of an ace and HTCZ

## 2012-04-15 NOTE — Progress Notes (Signed)
Quick Note:  Left a message for pt to return call. ______ 

## 2012-06-10 DIAGNOSIS — L570 Actinic keratosis: Secondary | ICD-10-CM | POA: Diagnosis not present

## 2012-06-10 DIAGNOSIS — L723 Sebaceous cyst: Secondary | ICD-10-CM | POA: Diagnosis not present

## 2012-06-10 DIAGNOSIS — L819 Disorder of pigmentation, unspecified: Secondary | ICD-10-CM | POA: Diagnosis not present

## 2012-06-10 DIAGNOSIS — D485 Neoplasm of uncertain behavior of skin: Secondary | ICD-10-CM | POA: Diagnosis not present

## 2012-06-10 DIAGNOSIS — D1801 Hemangioma of skin and subcutaneous tissue: Secondary | ICD-10-CM | POA: Diagnosis not present

## 2012-07-07 ENCOUNTER — Other Ambulatory Visit: Payer: Self-pay | Admitting: Internal Medicine

## 2012-07-11 ENCOUNTER — Ambulatory Visit: Payer: Medicare Other | Admitting: Internal Medicine

## 2012-07-22 ENCOUNTER — Encounter: Payer: Self-pay | Admitting: Internal Medicine

## 2012-07-22 ENCOUNTER — Ambulatory Visit (INDEPENDENT_AMBULATORY_CARE_PROVIDER_SITE_OTHER): Payer: Medicare Other | Admitting: Internal Medicine

## 2012-07-22 VITALS — BP 140/80 | HR 76 | Temp 99.3°F | Resp 16 | Ht 62.0 in | Wt 135.0 lb

## 2012-07-22 DIAGNOSIS — I10 Essential (primary) hypertension: Secondary | ICD-10-CM

## 2012-07-22 DIAGNOSIS — E785 Hyperlipidemia, unspecified: Secondary | ICD-10-CM | POA: Diagnosis not present

## 2012-07-22 DIAGNOSIS — J019 Acute sinusitis, unspecified: Secondary | ICD-10-CM

## 2012-07-22 MED ORDER — AZITHROMYCIN 250 MG PO TABS: ORAL_TABLET | ORAL | Status: DC

## 2012-07-22 NOTE — Patient Instructions (Signed)
The patient is instructed to continue all medications as prescribed. Schedule followup with check out clerk upon leaving the clinic  

## 2012-07-22 NOTE — Progress Notes (Signed)
Subjective:    Patient ID: Theresa Chambers, female    DOB: 18-Jul-1933, 78 y.o.   MRN: [id number]  HPI Upper respiratory tract infection symptoms 24-48 hours duration.  Followup for blood pressure was initial elevation of blood pressure today she's been taking a decongestant and an antiviral Zicam She has stable hypertension history Is examination is in the summer.    Review of Systems  Constitutional: Negative.   HENT: Positive for congestion, postnasal drip and sinus pressure.   Eyes: Negative.   Respiratory: Positive for cough.   Cardiovascular: Negative.   Genitourinary: Negative.   Neurological: Negative.    Past Medical History  Diagnosis Date  . Hypertension   . Hyperlipidemia   . Osteopenia   . History of lumpectomy     History   Social History  . Marital Status: Married    Spouse Name: N/A    Number of Children: N/A  . Years of Education: N/A   Occupational History  . Not on file.   Social History Main Topics  . Smoking status: Former Games developer  . Smokeless tobacco: Not on file     Comment: quit in 1975  . Alcohol Use: Yes  . Drug Use: No  . Sexually Active: Not Currently   Other Topics Concern  . Not on file   Social History Narrative  . No narrative on file    Past Surgical History  Procedure Date  . Tonsillectomy   . Breast lumpectomy     Family History  Problem Relation Age of Onset  . Anuerysm Mother   . Hypertension Father     Allergies  Allergen Reactions  . Codeine   . Penicillins     Current Outpatient Prescriptions on File Prior to Visit  Medication Sig Dispense Refill  . ALPRAZolam (XANAX) 0.25 MG tablet Take 1 tablet (0.25 mg total) by mouth at bedtime as needed for sleep. 1-2 every 4-6 hrs as needed  30 tablet  2  . amLODipine (NORVASC) 5 MG tablet take 1 tablet by mouth once daily  90 tablet  3  . aspirin 81 MG tablet Take 81 mg by mouth daily.        . Calcium Carbonate (CALTRATE 600 PO) Take by mouth daily.         . fish oil-omega-3 fatty acids 1000 MG capsule Take 1 capsule (1 g total) by mouth 2 (two) times daily.      Marland Kitchen lisinopril-hydrochlorothiazide (PRINZIDE,ZESTORETIC) 10-12.5 MG per tablet Take 1 tablet by mouth daily.  30 tablet  5  . Multiple Vitamin (MULTIVITAMIN) tablet Take 1 tablet by mouth daily.        . Red Yeast Rice 600 MG CAPS Take 1 capsule (600 mg total) by mouth 2 (two) times daily.        BP 140/80  Pulse 76  Temp 99.3 F (37.4 C)  Resp 16  Ht 5\' 2"  (1.575 m)  Wt 135 lb (61.236 kg)  BMI 24.69 kg/m2       Objective:   Physical Exam  Nursing note and vitals reviewed. Constitutional: She is oriented to person, place, and time. She appears well-developed and well-nourished. No distress.  HENT:  Right Ear: External ear normal.  Left Ear: External ear normal.  Nose: Nose normal.  Mouth/Throat: Oropharynx is clear and moist.  Eyes: Conjunctivae normal and EOM are normal. Pupils are equal, round, and reactive to light.       turbinate swollen and red  Neck:  Normal range of motion. Neck supple. No JVD present. No tracheal deviation present. No thyromegaly present.  Cardiovascular: Normal rate and regular rhythm.   Murmur heard. Pulmonary/Chest: Effort normal and breath sounds normal. She has no wheezes. She exhibits no tenderness.  Abdominal: Soft. Bowel sounds are normal.  Musculoskeletal: Normal range of motion. She exhibits no edema and no tenderness.  Lymphadenopathy:    She has no cervical adenopathy.  Neurological: She is alert and oriented to person, place, and time. She has normal reflexes. No cranial nerve deficit.  Skin: Skin is warm and dry. She is not diaphoretic.  Psychiatric: She has a normal mood and affect. Her behavior is normal.          Assessment & Plan:  Stable blood pressure Patient's physical examination is consistent with sinusitis.  Will give her a azithromycin protocol pack blood pressure stable current medications followup in 6  months for wellness examination

## 2012-08-01 ENCOUNTER — Telehealth: Payer: Self-pay | Admitting: Internal Medicine

## 2012-08-01 NOTE — Telephone Encounter (Signed)
Patient Information:  Caller Name: Vanesa  Phone: [phone]  Patient: Theresa Chambers, Theresa Chambers  Gender: Female  DOB: [birth year]  Age: 79 Years  PCP: Darryll Capers (Adults only)  Office Follow Up:  Does the office need to follow up with this patient?: No  Instructions For The Office: N/A  RN Note:  Completed Azithromycin. Mild wheezing intermittently; not present now. Cough is worse when eating and talking.  Instructed to stop using Delsym, try Mucinex as directed, humidifer, warm fluids followed by honey.  Symptoms  Reason For Call & Symptoms: Frequent, dry, daytime cough  Reviewed Health History In EMR: Yes  Reviewed Medications In EMR: Yes  Reviewed Allergies In EMR: Yes  Reviewed Surgeries / Procedures: Yes  Date of Onset of Symptoms: 07/21/2012  Treatments Tried: Deslym, tea, rest  Treatments Tried Worked: Yes  Guideline(s) Used:  Cough  Disposition Per Guideline:   See Today or Tomorrow in Office  Reason For Disposition Reached:   Continuous (nonstop) coughing interferes with work or school and no improvement using cough treatment per Care Advice  Advice Given:  Reassurance  Coughing is the way that our lungs remove irritants and mucus. It helps protect our lungs from getting pneumonia.  You can get a dry hacking cough after a chest cold. Sometimes this type of cough can last 1-3 weeks, and be worse at night.  You can also get a cough after being exposed to irritating substances like smoke, strong perfumes, and dust.  Here is some care advice that should help.  Cough Medicines:  Home Remedy - Hard Candy: Hard candy works just as well as medicine-flavored OTC cough drops. Diabetics should use sugar-free candy.  Home Remedy - Honey: This old home remedy has been shown to help decrease coughing at night. The adult dosage is 2 teaspoons (10 ml) at bedtime. Honey should not be given to infants under one year of age.  Coughing Spasms:  Drink warm fluids. Inhale warm mist  (Reason: both relax the airway and loosen up the phlegm).  Suck on cough drops or hard candy to coat the irritated throat.  Prevent Dehydration:  Drink adequate liquids.  Prevent Dehydration:  Drink adequate liquids.  This will help soothe an irritated or dry throat and loosen up the phlegm.  Call Back If:  Difficulty breathing  Cough lasts more than 3 weeks  Fever lasts > 3 days  You become worse.  Appointment Scheduled:  08/02/2012 09:15:00 Appointment Scheduled Provider:  Darryll Capers (Adults only)

## 2012-08-02 ENCOUNTER — Telehealth: Payer: Self-pay | Admitting: Internal Medicine

## 2012-08-02 ENCOUNTER — Ambulatory Visit (INDEPENDENT_AMBULATORY_CARE_PROVIDER_SITE_OTHER): Payer: Medicare Other | Admitting: Internal Medicine

## 2012-08-02 ENCOUNTER — Encounter: Payer: Self-pay | Admitting: Internal Medicine

## 2012-08-02 ENCOUNTER — Ambulatory Visit (INDEPENDENT_AMBULATORY_CARE_PROVIDER_SITE_OTHER)
Admission: RE | Admit: 2012-08-02 | Discharge: 2012-08-02 | Disposition: A | Discharge status: Home / Self Care | Payer: Medicare Other | Source: Ambulatory Visit | Attending: Internal Medicine | Admitting: Internal Medicine

## 2012-08-02 VITALS — BP 140/80 | HR 76 | Temp 98.1°F | Resp 16 | Ht 62.0 in | Wt 135.0 lb

## 2012-08-02 DIAGNOSIS — R05 Cough: Secondary | ICD-10-CM

## 2012-08-02 DIAGNOSIS — R059 Cough, unspecified: Secondary | ICD-10-CM

## 2012-08-02 DIAGNOSIS — R053 Chronic cough: Secondary | ICD-10-CM | POA: Insufficient documentation

## 2012-08-02 DIAGNOSIS — I10 Essential (primary) hypertension: Secondary | ICD-10-CM | POA: Diagnosis not present

## 2012-08-02 MED ORDER — METHYLPREDNISOLONE (PAK) 4 MG PO TABS: ORAL_TABLET | ORAL | Status: DC

## 2012-08-02 MED ORDER — BENZONATATE 200 MG PO CAPS: 200.0000 mg | ORAL_CAPSULE | Freq: Two times a day (BID) | ORAL | Status: DC | PRN

## 2012-08-02 NOTE — Telephone Encounter (Signed)
atient calling for X-Ray results done today-  Reviewed EPIC. Results clear.  No issues.  Patient is aware to continue treatment as prescribed and call back Monday if cough is not better.

## 2012-08-02 NOTE — Patient Instructions (Signed)
[facility] office for a chest x-ray  I am going to give you a cough medicine and a Medrol Dosepak to see if we can extinguish the cough. If the cough persists by Monday please call back and we will change her blood pressure medicine

## 2012-08-02 NOTE — Telephone Encounter (Signed)
Dr Lovell Sheehan Left message on machine For pt

## 2012-08-02 NOTE — Progress Notes (Signed)
  Subjective:    Patient ID: Theresa Chambers, female    DOB: 01-06-1933, 79 y.o.   MRN: [id number]  HPI  Cough has been two weeks and was treated with an antibiotic.  Worse in AM. No night time cough. Talking and eating makes it worse Points to larynx as site of tickle She is on an ace drug.    Review of Systems  Constitutional: Negative for activity change, appetite change and fatigue.  HENT: Positive for congestion, rhinorrhea and postnasal drip. Negative for ear pain, neck pain and sinus pressure.   Eyes: Negative for redness and visual disturbance.  Respiratory: Positive for cough. Negative for shortness of breath and wheezing.   Gastrointestinal: Negative for abdominal pain and abdominal distention.  Genitourinary: Negative for dysuria, frequency and menstrual problem.  Musculoskeletal: Negative for myalgias, joint swelling and arthralgias.  Skin: Negative for rash and wound.  Neurological: Negative for dizziness, weakness and headaches.  Hematological: Negative for adenopathy. Does not bruise/bleed easily.  Psychiatric/Behavioral: Negative for sleep disturbance and decreased concentration.       Objective:   Physical Exam  Constitutional: She is oriented to person, place, and time. She appears well-developed and well-nourished. No distress.  HENT:  Head: Normocephalic and atraumatic.  Right Ear: External ear normal.  Left Ear: External ear normal.  Nose: Nose normal.  Mouth/Throat: Oropharynx is clear and moist.  Eyes: Conjunctivae normal and EOM are normal. Pupils are equal, round, and reactive to light.  Neck: Normal range of motion. Neck supple. No JVD present. No tracheal deviation present. No thyromegaly present.  Cardiovascular: Normal rate, regular rhythm, normal heart sounds and intact distal pulses.   No murmur heard. Pulmonary/Chest: Effort normal and breath sounds normal. She has no wheezes. She exhibits no tenderness.  Abdominal: Soft. Bowel sounds are  normal.  Musculoskeletal: Normal range of motion. She exhibits no edema and no tenderness.  Lymphadenopathy:    She has no cervical adenopathy.  Neurological: She is alert and oriented to person, place, and time. She has normal reflexes. No cranial nerve deficit.  Skin: Skin is warm and dry. She is not diaphoretic.  Psychiatric: She has a normal mood and affect. Her behavior is normal.          Assessment & Plan:  Chronic cough Check CXR Medrol dose pack and tessilon perles monoter blood pressure and consider change from ACE if cough persists

## 2012-08-05 ENCOUNTER — Telehealth: Payer: Self-pay | Admitting: Internal Medicine

## 2012-08-05 MED ORDER — BISOPROLOL-HYDROCHLOROTHIAZIDE 5-6.25 MG PO TABS: 1.0000 | ORAL_TABLET | Freq: Every day | ORAL | Status: DC

## 2012-08-05 NOTE — Telephone Encounter (Signed)
Stop lisinopril and start amlodipine 5 mg qd

## 2012-08-05 NOTE — Telephone Encounter (Signed)
Caller: Talaysia/Patient; Phone: [phone]; Reason for Call: Patient was told to inform Dr. Lovell Sheehan if her coughing had not improved by today.  She reports the coughing has continued.  Reports dry cough.  Patient has been taking prescriptions given with no improvement noted.  PLEASE CALL HER BACK WITH FURTHER INSTRUCTIONS. THANKS.

## 2012-08-22 ENCOUNTER — Telehealth: Payer: Self-pay | Admitting: Internal Medicine

## 2012-08-22 MED ORDER — HYDROCHLOROTHIAZIDE 25 MG PO TABS: 25.0000 mg | ORAL_TABLET | Freq: Every day | ORAL | Status: DC

## 2012-08-22 NOTE — Telephone Encounter (Signed)
Dr Lovell Sheehan out of the office.  Per Dr Cato Mulligan d/c Ziac and call in HCTZ 25mg  once daily and f/u with Dr Lovell Sheehan in 2 mths.  Pt aware, rx sent in electronically to [facility]

## 2012-08-22 NOTE — Telephone Encounter (Signed)
Patient Information:  Caller Name: Ashaunti  Phone: [phone]  Patient: Theresa Chambers  Gender: Female  DOB: May 27, 1933  Age: 79 Years  PCP: Darryll Capers (Adults only)  Office Follow Up:  Does the office need to follow up with this patient?: Yes  Instructions For The Office: Her pulse is as low as 44 - (all day). 57 upon waking. She is concerned. SHE HAS NOT TAKEN HER BLOOD PRESSURE MEDICATION YET AND IS CONCERNED SHOULD SHE TAKE IT.  RN Note:  No dizziness or lightheaded. No weakness noted.  Tired "but I am 79 years old"  Pulse and B/P at present- 146/73 Pulse 64.  She states that is the highest her pulse has been.  Her pulse is as low as 44 - (all day). 57 upon waking. She is concerned. SHE HAS NOT TAKEN HER BLOOD PRESSURE MEDICATION YET AND IS CONCERNED SHOULD SHE TAKE IT.   Advised I would forward Pulse rate concerns to physician for review.  PLEASE CONTACT - [phone]   CELL- [phone] (AFTER 12:00N)  Symptoms  Reason For Call & Symptoms: Patient states her Blood pressure medication was changed two weeks ago .  She states that her pulse is very low at 44 yesterday, B/P 126/76. Marland Kitchen  She reports that this was the first time she took her pulse and blood pressure.    She is currently on Norvasc 5mg  daily and Ziac-HCTZ  5-6.25mg   Reviewed Health History In EMR: Yes  Reviewed Medications In EMR: Yes  Reviewed Allergies In EMR: Yes  Reviewed Surgeries / Procedures: No  Date of Onset of Symptoms: 08/21/2012  Guideline(s) Used:  High Blood Pressure  Disposition Per Guideline:   Home Care  Reason For Disposition Reached:   BP < 120/80  Advice Given:  BP less than 120 / 80   This is considered normal blood pressure  RN Overrode Recommendation:  Follow Up With Office Later  Her pulse is as low as 44 - (all day). 57 upon waking. She is concerned. SHE HAS NOT TAKEN HER BLOOD PRESSURE MEDICATION YET AND IS CONCERNED SHOULD SHE TAKE IT.   Please contact patient

## 2012-08-30 ENCOUNTER — Telehealth: Payer: Self-pay | Admitting: Internal Medicine

## 2012-08-30 NOTE — Telephone Encounter (Signed)
Patient Information:  Caller Name: Theresa Chambers  Phone: [phone]  Patient: Theresa Chambers, Theresa Chambers  Gender: Female  DOB: 1933/06/12  Age: 79 Years  PCP: Darryll Capers (Adults only)  Office Follow Up:  Does the office need to follow up with this patient?: Yes  Instructions For The Office: Please call Theresa Chambers at cell # [phone] or Home [phone]- Was started on HCTZ 2 weeks ago. Takes Norvasc at night. Had onsetof ankle edema and extreme fatigue in afternoon after starting HCTZ.   Symptoms  Reason For Call & Symptoms: [name] states she was seen in office 2 weeks ago and was started on HCTZ in AM. Takes Norvasc every night. States she had onset of ankle swelling  and extreme fatigue in the afternoon after starting HCTZ.  Reviewed Health History In EMR: Yes  Reviewed Medications In EMR: Yes  Reviewed Allergies In EMR: Yes  Reviewed Surgeries / Procedures: Yes  Date of Onset of Symptoms: 08/16/2012  Guideline(s) Used:  High Blood Pressure  Leg Swelling and Edema  Disposition Per Guideline:   Discuss with PCP and Callback by Nurse Today  Reason For Disposition Reached:   Taking BP medications and feels is having side effects (e.g., impotence, cough, dizziness)  Advice Given:  N/A

## 2012-08-30 NOTE — Telephone Encounter (Signed)
Per dr Lovell Sheehan- take 1/2 of norvasc daily-check bp this weekend and let us know how bp is runnign on Monday-pt informed

## 2012-09-02 ENCOUNTER — Telehealth: Payer: Self-pay | Admitting: Internal Medicine

## 2012-09-02 MED ORDER — TRIAMTERENE-HCTZ 37.5-25 MG PO TABS: 1.0000 | ORAL_TABLET | Freq: Every day | ORAL | Status: DC

## 2012-09-02 NOTE — Telephone Encounter (Signed)
Patient Information:  Caller Name: Jazzalynn  Phone: [phone]  Patient: Theresa Chambers, Theresa Chambers  Gender: Female  DOB: 04-09-1933  Age: 79 Years  PCP: Darryll Capers (Adults only)  Office Follow Up:  Does the office need to follow up with this patient?: Yes  Instructions For The Office: Please review notes and verify if there are changes she needs to make.  She was instructed to make the medication changes and report BP readings to office on 09/02/12.   Symptoms  Reason For Call & Symptoms: Patient calling about her BP and medication adjustments made 08/30/12. Had BP checked at [facility] and results were:   Saturday  4pm 150/90, Sunday 4 pm 154/88.  Mild ankle swelling, improved; relates it resolves at night.  Denies side effects such as cough, dizziness or fatigue.  Reviewed Health History In EMR: Yes  Reviewed Medications In EMR: Yes  Reviewed Allergies In EMR: Yes  Reviewed Surgeries / Procedures: Yes  Date of Onset of Symptoms: Unknown  Treatments Tried: Taking 1/2 tablet Norvasc /Amlodipine as ordered on 08/30/12.  Treatments Tried Worked: No  Guideline(s) Used:  High Blood Pressure  Disposition Per Guideline:   See Within 2 Weeks in Office  Reason For Disposition Reached:   BP > 140/90 and is taking BP medications  Advice Given:  General:  Untreated high blood pressure may cause damage to the heart, brain, kidneys, and eyes.  Treatment of high blood pressure can reduce the risk of stroke, heart attack, and heart failure.  The goal of blood pressure treatment for most patients with hypertension is to keep the blood pressure under 140/90.  Lifestyle Changes  Maintain a healthy weight. Lose weight if you are overweight.  Eat a diet high in fresh fruits and low-fat dairy products. Limit your intake of saturated and total fat. Choose foods that are lower in salt.  If you smoke, you should stop.  If you drink alcohol, you should limit your daily alcohol drinking. Women should  have no more than one drink per day. Men should have no more than 2 drinks per day. A drink is defined as 1.5 oz hard liquor (one shot or jigger; 45 ml), 5 oz wine (small glass; 150 ml), or 12 oz beer (one can; 360 ml).  Call Back If:  Headache, blurred vision, difficulty talking, or difficulty walking occurs  Chest pain or difficulty breathing occurs  You become worse.  RN Overrode Recommendation:  Document Patient  Note to provider per instructions in EMR.

## 2012-09-02 NOTE — Telephone Encounter (Signed)
Change hctz to maxzide 25 and continue with norvasc   5 mg at bedtime- pt informed

## 2012-10-03 ENCOUNTER — Telehealth: Payer: Self-pay | Admitting: Internal Medicine

## 2012-10-03 NOTE — Telephone Encounter (Signed)
Talked with pt and th maxzide is what is making her sick- has ov with dr Lovell Sheehan 3-19-will continue with president meds and will ask dr Lovell Sheehan what to do until appointment next week

## 2012-10-03 NOTE — Telephone Encounter (Signed)
Patient Information:  Caller Name: Cait  Phone: [phone]  Patient: Theresa Chambers, Theresa Chambers  Gender: Female  DOB: 1933-03-03  Age: 79 Years  PCP: Darryll Capers (Adults only)  Office Follow Up:  Does the office need to follow up with this patient?: Yes  Instructions For The Office: If patient isn't at home, please call her cell phone at [phone].   Symptoms  Reason For Call & Symptoms: Patient calling about her current b/p medication.  About an hour after taking same she has a jittery feeling and nausea.  Denies any dizziness.  Reviewed Health History In EMR: Yes  Reviewed Medications In EMR: Yes  Reviewed Allergies In EMR: Yes  Reviewed Surgeries / Procedures: Yes  Date of Onset of Symptoms: 08/24/2012  Guideline(s) Used:  No Protocol Available - Information Only  Disposition Per Guideline:   Discuss with PCP and Callback by Nurse Today  Reason For Disposition Reached:   Nursing judgment  Advice Given:  Call Back If:

## 2012-10-04 NOTE — Telephone Encounter (Signed)
Replace the maxide with lozol 1.25mg 

## 2012-10-07 MED ORDER — INDAPAMIDE 1.25 MG PO TABS: 1.2500 mg | ORAL_TABLET | ORAL | Status: DC

## 2012-10-07 NOTE — Telephone Encounter (Signed)
New Rx sent and Left message on machine for patient  

## 2012-10-09 ENCOUNTER — Ambulatory Visit (INDEPENDENT_AMBULATORY_CARE_PROVIDER_SITE_OTHER): Payer: Medicare Other | Admitting: Internal Medicine

## 2012-10-09 ENCOUNTER — Encounter: Payer: Self-pay | Admitting: Internal Medicine

## 2012-10-09 VITALS — BP 140/88 | HR 68 | Temp 97.7°F | Wt 131.0 lb

## 2012-10-09 DIAGNOSIS — R252 Cramp and spasm: Secondary | ICD-10-CM

## 2012-10-09 DIAGNOSIS — I10 Essential (primary) hypertension: Secondary | ICD-10-CM

## 2012-10-09 LAB — BASIC METABOLIC PANEL
CO2: 29 mEq/L (ref 19–32)
Calcium: 9.9 mg/dL (ref 8.4–10.5)
Creatinine, Ser: 1.1 mg/dL (ref 0.4–1.2)
GFR: 52.76 mL/min — ABNORMAL LOW (ref >60.00)
Sodium: 139 mEq/L (ref 135–145)

## 2012-10-09 LAB — MAGNESIUM: Magnesium: 2.2 mg/dL (ref 1.5–2.5)

## 2012-10-09 MED ORDER — AMLODIPINE BESYLATE 5 MG PO TABS: ORAL_TABLET | ORAL | Status: DC

## 2012-10-09 NOTE — Progress Notes (Signed)
Subjective:    Patient ID: Theresa Chambers, female    DOB: June 23, 1933, 79 y.o.   MRN: [id number]  HPI Difficult  To control blood pressure The Maxzide made her feel gittery Has cut out coffee Repeat blood pressure is 144/88  Review of Systems  Constitutional: Negative for activity change, appetite change and fatigue.  HENT: Negative for ear pain, congestion, neck pain, postnasal drip and sinus pressure.   Eyes: Negative for redness and visual disturbance.  Respiratory: Negative for cough, shortness of breath and wheezing.   Gastrointestinal: Negative for abdominal pain and abdominal distention.  Genitourinary: Negative for dysuria, frequency and menstrual problem.  Musculoskeletal: Negative for myalgias, joint swelling and arthralgias.  Skin: Negative for rash and wound.  Neurological: Positive for weakness. Negative for dizziness and headaches.       Gittery  Hematological: Negative for adenopathy. Does not bruise/bleed easily.  Psychiatric/Behavioral: Negative for sleep disturbance and decreased concentration.   Past Medical History  Diagnosis Date  . Hypertension   . Hyperlipidemia   . Osteopenia   . History of lumpectomy     History   Social History  . Marital Status: Married    Spouse Name: N/A    Number of Children: N/A  . Years of Education: N/A   Occupational History  . Not on file.   Social History Main Topics  . Smoking status: Former Games developer  . Smokeless tobacco: Not on file     Comment: quit in 1975  . Alcohol Use: Yes  . Drug Use: No  . Sexually Active: Not Currently   Other Topics Concern  . Not on file   Social History Narrative  . No narrative on file    Past Surgical History  Procedure Laterality Date  . Tonsillectomy    . Breast lumpectomy      Family History  Problem Relation Age of Onset  . Anuerysm Mother   . Hypertension Father     Allergies  Allergen Reactions  . Codeine   . Penicillins     Current Outpatient  Prescriptions on File Prior to Visit  Medication Sig Dispense Refill  . ALPRAZolam (XANAX) 0.25 MG tablet Take 1 tablet (0.25 mg total) by mouth at bedtime as needed for sleep. 1-2 every 4-6 hrs as needed  30 tablet  2  . aspirin 81 MG tablet Take 81 mg by mouth daily.        . benzonatate (TESSALON) 200 MG capsule Take 1 capsule (200 mg total) by mouth 2 (two) times daily as needed for cough.  20 capsule  0  . Calcium Carbonate (CALTRATE 600 PO) Take by mouth daily.        . fish oil-omega-3 fatty acids 1000 MG capsule Take 1 capsule (1 g total) by mouth 2 (two) times daily.      . indapamide (LOZOL) 1.25 MG tablet Take 1 tablet (1.25 mg total) by mouth every morning.  30 tablet  3  . methylPREDNIsolone (MEDROL DOSPACK) 4 MG tablet follow package directions  21 tablet  0  . Multiple Vitamin (MULTIVITAMIN) tablet Take 1 tablet by mouth daily.        . Red Yeast Rice 600 MG CAPS Take 1 capsule (600 mg total) by mouth 2 (two) times daily.       No current facility-administered medications on file prior to visit.    BP 140/88  Pulse 68  Temp(Src) 97.7 F (36.5 C) (Oral)  Wt 131 lb (59.421 kg)  BMI 23.95 kg/m2       Objective:   Physical Exam  Constitutional: She is oriented to person, place, and time. She appears well-developed and well-nourished. No distress.  HENT:  Head: Normocephalic and atraumatic.  Right Ear: External ear normal.  Left Ear: External ear normal.  Nose: Nose normal.  Mouth/Throat: Oropharynx is clear and moist.  Eyes: Conjunctivae and EOM are normal. Pupils are equal, round, and reactive to light.  Neck: Normal range of motion. Neck supple. No JVD present. No tracheal deviation present. No thyromegaly present.  Cardiovascular: Normal rate, regular rhythm and intact distal pulses.   Pulmonary/Chest: Effort normal and breath sounds normal. She has no wheezes. She exhibits no tenderness.  Abdominal: Soft. Bowel sounds are normal.  Musculoskeletal: Normal range of  motion. She exhibits no edema and no tenderness.  Lymphadenopathy:    She has no cervical adenopathy.  Neurological: She is alert and oriented to person, place, and time. She has normal reflexes. No cranial nerve deficit.  Skin: She is not diaphoretic.          Assessment & Plan:  Change the maxide to the lozol measure base line potassium The cough has improved dramatically Labs today

## 2012-10-09 NOTE — Patient Instructions (Signed)
Start the Lozol in the morning as you diuretic be sure to have a glass of orange juice or banana every morning and continue taking a half of amlodipine at bedtime

## 2013-01-20 ENCOUNTER — Ambulatory Visit (INDEPENDENT_AMBULATORY_CARE_PROVIDER_SITE_OTHER): Payer: Medicare Other | Admitting: Internal Medicine

## 2013-01-20 ENCOUNTER — Encounter: Payer: Self-pay | Admitting: Internal Medicine

## 2013-01-20 ENCOUNTER — Other Ambulatory Visit: Payer: Self-pay | Admitting: *Deleted

## 2013-01-20 VITALS — BP 146/80 | HR 72 | Temp 98.2°F | Resp 16 | Ht 62.0 in | Wt 136.0 lb

## 2013-01-20 DIAGNOSIS — I10 Essential (primary) hypertension: Secondary | ICD-10-CM

## 2013-01-20 DIAGNOSIS — E785 Hyperlipidemia, unspecified: Secondary | ICD-10-CM | POA: Diagnosis not present

## 2013-01-20 DIAGNOSIS — T887XXA Unspecified adverse effect of drug or medicament, initial encounter: Secondary | ICD-10-CM | POA: Diagnosis not present

## 2013-01-20 LAB — BASIC METABOLIC PANEL
Calcium: 10 mg/dL (ref 8.4–10.5)
GFR: 75.93 mL/min (ref >60.00)
Glucose, Bld: 107 mg/dL — ABNORMAL HIGH (ref 70–99)
Potassium: 3.6 mEq/L (ref 3.5–5.1)
Sodium: 139 mEq/L (ref 135–145)

## 2013-01-20 LAB — LIPID PANEL
HDL: 61.8 mg/dL (ref >39.00)
Total CHOL/HDL Ratio: 4
Triglycerides: 100 mg/dL (ref 0.0–149.0)
VLDL: 20 mg/dL (ref 0.0–40.0)

## 2013-01-20 LAB — HEPATIC FUNCTION PANEL
ALT: 16 U/L (ref 0–35)
Bilirubin, Direct: 0.1 mg/dL (ref 0.0–0.3)
Total Bilirubin: 1 mg/dL (ref 0.3–1.2)

## 2013-01-20 LAB — LDL CHOLESTEROL, DIRECT: Direct LDL: 171 mg/dL

## 2013-01-20 LAB — TSH: TSH: 2.02 u[IU]/mL (ref 0.35–5.50)

## 2013-01-20 MED ORDER — ALPRAZOLAM 0.25 MG PO TABS: 0.2500 mg | ORAL_TABLET | Freq: Every evening | ORAL | Status: DC | PRN

## 2013-01-20 NOTE — Progress Notes (Signed)
Subjective:    Patient ID: Theresa Chambers, female    DOB: 1933/06/23, 79 y.o.   MRN: [id number]  HPI Elevated blood pressure in office But readings at home are normal 120's over 80's Today the blood pressure was elevated but she admits to  Due blood work , did not eat of take medications    Review of Systems  Constitutional: Negative for activity change, appetite change and fatigue.  HENT: Negative for ear pain, congestion, neck pain, postnasal drip and sinus pressure.   Eyes: Negative for redness and visual disturbance.  Respiratory: Negative for cough, shortness of breath and wheezing.   Gastrointestinal: Negative for abdominal pain and abdominal distention.  Genitourinary: Negative for dysuria, frequency and menstrual problem.  Musculoskeletal: Negative for myalgias, joint swelling and arthralgias.  Skin: Negative for rash and wound.  Neurological: Negative for dizziness, weakness and headaches.  Hematological: Negative for adenopathy. Does not bruise/bleed easily.  Psychiatric/Behavioral: Positive for dysphoric mood. Negative for sleep disturbance and decreased concentration.   Past Medical History  Diagnosis Date  . Hypertension   . Hyperlipidemia   . Osteopenia   . History of lumpectomy     History   Social History  . Marital Status: Married    Spouse Name: N/A    Number of Children: N/A  . Years of Education: N/A   Occupational History  . Not on file.   Social History Main Topics  . Smoking status: Former Games developer  . Smokeless tobacco: Not on file     Comment: quit in 1975  . Alcohol Use: Yes  . Drug Use: No  . Sexually Active: Not Currently   Other Topics Concern  . Not on file   Social History Narrative  . No narrative on file    Past Surgical History  Procedure Laterality Date  . Tonsillectomy    . Breast lumpectomy      Family History  Problem Relation Age of Onset  . Anuerysm Mother   . Hypertension Father     Allergies   Allergen Reactions  . Codeine   . Penicillins     Current Outpatient Prescriptions on File Prior to Visit  Medication Sig Dispense Refill  . amLODipine (NORVASC) 5 MG tablet take 1/2 tablet by mouth  At bed time  90 tablet  3  . aspirin 81 MG tablet Take 81 mg by mouth daily.        . benzonatate (TESSALON) 200 MG capsule Take 1 capsule (200 mg total) by mouth 2 (two) times daily as needed for cough.  20 capsule  0  . Calcium Carbonate (CALTRATE 600 PO) Take by mouth daily.        . fish oil-omega-3 fatty acids 1000 MG capsule Take 1 capsule (1 g total) by mouth 2 (two) times daily.      . indapamide (LOZOL) 1.25 MG tablet Take 1 tablet (1.25 mg total) by mouth every morning.  30 tablet  3  . methylPREDNIsolone (MEDROL DOSPACK) 4 MG tablet follow package directions  21 tablet  0  . Multiple Vitamin (MULTIVITAMIN) tablet Take 1 tablet by mouth daily.        . Red Yeast Rice 600 MG CAPS Take 1 capsule (600 mg total) by mouth 2 (two) times daily.       No current facility-administered medications on file prior to visit.    BP 146/80  Pulse 72  Temp(Src) 98.2 F (36.8 C)  Resp 16  Ht 5\' 2"  (1.575  m)  Wt 136 lb (61.689 kg)  BMI 24.87 kg/m2       Objective:   Physical Exam  Nursing note and vitals reviewed. Constitutional: She is oriented to person, place, and time. She appears well-developed and well-nourished. No distress.  HENT:  Head: Normocephalic and atraumatic.  Eyes: Conjunctivae and EOM are normal. Pupils are equal, round, and reactive to light.  Neck: Normal range of motion. Neck supple. No JVD present. No tracheal deviation present. No thyromegaly present.  Cardiovascular: Normal rate, regular rhythm and intact distal pulses.   Murmur heard. Pulmonary/Chest: Effort normal and breath sounds normal. She has no wheezes. She exhibits no tenderness.  Abdominal: Soft. Bowel sounds are normal.  Musculoskeletal: Normal range of motion. She exhibits no edema and no  tenderness.  Lymphadenopathy:    She has no cervical adenopathy.  Neurological: She is alert and oriented to person, place, and time. She has normal reflexes. No cranial nerve deficit.  Skin: Skin is warm and dry. She is not diaphoretic.  Psychiatric: She has a normal mood and affect. Her behavior is normal.          Assessment & Plan:  Weight is good but she is worried about central fat We discussed limiting gluten as a strategy htn  Stable lipids due monitoring Today we will monitor her cholesterol basic metabolic panel liver panel.

## 2013-01-20 NOTE — Patient Instructions (Signed)
The patient is instructed to continue all medications as prescribed. Schedule followup with check out clerk upon leaving the clinic  

## 2013-01-29 ENCOUNTER — Other Ambulatory Visit (INDEPENDENT_AMBULATORY_CARE_PROVIDER_SITE_OTHER): Payer: Medicare Other

## 2013-01-29 ENCOUNTER — Other Ambulatory Visit: Payer: Self-pay | Admitting: *Deleted

## 2013-01-29 DIAGNOSIS — F22 Delusional disorders: Secondary | ICD-10-CM | POA: Diagnosis not present

## 2013-01-29 LAB — POCT URINALYSIS DIPSTICK
Ketones, UA: NEGATIVE
Protein, UA: NEGATIVE
Urobilinogen, UA: 0.2
pH, UA: 7

## 2013-01-29 MED ORDER — ATORVASTATIN CALCIUM 20 MG PO TABS: ORAL_TABLET | ORAL | Status: DC

## 2013-02-02 ENCOUNTER — Other Ambulatory Visit: Payer: Self-pay | Admitting: Internal Medicine

## 2013-04-17 ENCOUNTER — Other Ambulatory Visit: Payer: Self-pay

## 2013-04-17 DIAGNOSIS — Z1231 Encounter for screening mammogram for malignant neoplasm of breast: Secondary | ICD-10-CM

## 2013-04-22 ENCOUNTER — Ambulatory Visit
Admission: RE | Admit: 2013-04-22 | Discharge: 2013-04-22 | Disposition: A | Discharge status: Home / Self Care | Payer: Medicare Other | Source: Ambulatory Visit

## 2013-04-22 DIAGNOSIS — Z1231 Encounter for screening mammogram for malignant neoplasm of breast: Secondary | ICD-10-CM | POA: Diagnosis not present

## 2013-07-14 ENCOUNTER — Other Ambulatory Visit: Payer: Self-pay | Admitting: Internal Medicine

## 2013-07-23 ENCOUNTER — Ambulatory Visit (INDEPENDENT_AMBULATORY_CARE_PROVIDER_SITE_OTHER): Payer: Medicare Other | Admitting: Internal Medicine

## 2013-07-23 ENCOUNTER — Encounter: Payer: Self-pay | Admitting: Internal Medicine

## 2013-07-23 VITALS — BP 140/80 | HR 76 | Temp 98.1°F | Resp 16 | Ht 62.0 in | Wt 132.0 lb

## 2013-07-23 DIAGNOSIS — E785 Hyperlipidemia, unspecified: Secondary | ICD-10-CM

## 2013-07-23 DIAGNOSIS — I10 Essential (primary) hypertension: Secondary | ICD-10-CM | POA: Diagnosis not present

## 2013-07-23 DIAGNOSIS — Z0000 Encounter for general adult medical examination without abnormal findings: Secondary | ICD-10-CM

## 2013-07-23 DIAGNOSIS — T887XXA Unspecified adverse effect of drug or medicament, initial encounter: Secondary | ICD-10-CM | POA: Diagnosis not present

## 2013-07-23 LAB — POCT URINALYSIS DIPSTICK
Glucose, UA: NEGATIVE
Nitrite, UA: NEGATIVE
Protein, UA: NEGATIVE
Spec Grav, UA: 1.02
Urobilinogen, UA: 0.2
pH, UA: 7

## 2013-07-23 LAB — HEPATIC FUNCTION PANEL
Albumin: 4.6 g/dL (ref 3.5–5.2)
Alkaline Phosphatase: 41 U/L (ref 39–117)
Total Bilirubin: 1.3 mg/dL — ABNORMAL HIGH (ref 0.3–1.2)
Total Protein: 7.3 g/dL (ref 6.0–8.3)

## 2013-07-23 LAB — CBC WITH DIFFERENTIAL/PLATELET
Eosinophils Relative: 1.9 % (ref 0.0–5.0)
HCT: 41.7 % (ref 36.0–46.0)
Hemoglobin: 14 g/dL (ref 12.0–15.0)
Lymphs Abs: 1.3 10*3/uL (ref 0.7–4.0)
MCV: 89.8 fl (ref 78.0–100.0)
Monocytes Absolute: 0.4 10*3/uL (ref 0.1–1.0)
Monocytes Relative: 8.1 % (ref 3.0–12.0)
Neutro Abs: 3.1 10*3/uL (ref 1.4–7.7)
Platelets: 224 10*3/uL (ref 150.0–400.0)
RBC: 4.64 Mil/uL (ref 3.87–5.11)
RDW: 13.9 % (ref 11.5–14.6)

## 2013-07-23 LAB — LIPID PANEL
Cholesterol: 173 mg/dL (ref 0–200)
HDL: 64.6 mg/dL (ref >39.00)
VLDL: 22.2 mg/dL (ref 0.0–40.0)

## 2013-07-23 LAB — BASIC METABOLIC PANEL
BUN: 16 mg/dL (ref 6–23)
Calcium: 9.8 mg/dL (ref 8.4–10.5)
Creatinine, Ser: 0.6 mg/dL (ref 0.4–1.2)
GFR: 113.48 mL/min (ref >60.00)
Glucose, Bld: 102 mg/dL — ABNORMAL HIGH (ref 70–99)
Sodium: 140 mEq/L (ref 135–145)

## 2013-07-23 NOTE — Progress Notes (Signed)
Subjective:    Patient ID: Theresa Chambers, female    DOB: [birth year], 79 y.o.   MRN: [id number]  HPI Follow up for depression     Review of Systems  Constitutional: Negative for activity change, appetite change and fatigue.  HENT: Negative for congestion, ear pain, postnasal drip and sinus pressure.   Eyes: Negative for redness and visual disturbance.  Respiratory: Negative for cough, shortness of breath and wheezing.   Gastrointestinal: Negative for abdominal pain and abdominal distention.  Genitourinary: Negative for dysuria, frequency and menstrual problem.  Musculoskeletal: Negative for arthralgias, joint swelling, myalgias and neck pain.  Skin: Negative for rash and wound.  Neurological: Negative for dizziness, weakness and headaches.  Hematological: Negative for adenopathy. Does not bruise/bleed easily.  Psychiatric/Behavioral: Negative for sleep disturbance and decreased concentration.   Past Medical History  Diagnosis Date  . Hypertension   . Hyperlipidemia   . Osteopenia   . History of lumpectomy     History   Social History  . Marital Status: Married    Spouse Name: N/A    Number of Children: N/A  . Years of Education: N/A   Occupational History  . Not on file.   Social History Main Topics  . Smoking status: Former Games developer  . Smokeless tobacco: Not on file     Comment: quit in 1975  . Alcohol Use: Yes  . Drug Use: No  . Sexual Activity: Not Currently   Other Topics Concern  . Not on file   Social History Narrative  . No narrative on file    Past Surgical History  Procedure Laterality Date  . Tonsillectomy    . Breast lumpectomy      Family History  Problem Relation Age of Onset  . Anuerysm Mother   . Hypertension Father     Allergies  Allergen Reactions  . Codeine   . Penicillins     Current Outpatient Prescriptions on File Prior to Visit  Medication Sig Dispense Refill  . ALPRAZolam (XANAX) 0.25 MG tablet Take 1 tablet (0.25  mg total) by mouth at bedtime as needed for sleep. 1-2 every 4-6 hrs as needed  30 tablet  2  . amLODipine (NORVASC) 5 MG tablet take 1/2 tablet by mouth  At bed time  90 tablet  3  . aspirin 81 MG tablet Take 81 mg by mouth daily.        Marland Kitchen atorvastatin (LIPITOR) 20 MG tablet 1 tab on Monday and friday  10 tablet  6  . Calcium Carbonate (CALTRATE 600 PO) Take by mouth daily.        . indapamide (LOZOL) 1.25 MG tablet take 1 tablet by mouth every morning  30 tablet  11  . Multiple Vitamin (MULTIVITAMIN) tablet Take 1 tablet by mouth daily.         No current facility-administered medications on file prior to visit.    BP 140/80  Pulse 76  Temp(Src) 98.1 F (36.7 C)  Resp 16  Ht 5\' 2"  (1.575 m)  Wt 132 lb (59.875 kg)  BMI 24.14 kg/m2        Objective:   Physical Exam  Constitutional: She is oriented to person, place, and time. She appears well-developed and well-nourished. No distress.  HENT:  Head: Normocephalic and atraumatic.  Eyes: Conjunctivae and EOM are normal. Pupils are equal, round, and reactive to light.  Neck: Normal range of motion. Neck supple. No JVD present. No tracheal deviation present. No thyromegaly present.  Cardiovascular: Normal rate and regular rhythm.   Murmur heard. Pulmonary/Chest: Effort normal and breath sounds normal. She has no wheezes. She exhibits no tenderness.  Abdominal: Soft. Bowel sounds are normal.  Musculoskeletal: Normal range of motion. She exhibits no edema and no tenderness.  Lymphadenopathy:    She has no cervical adenopathy.  Neurological: She is alert and oriented to person, place, and time. She has normal reflexes. No cranial nerve deficit.  Skin: Skin is warm and dry. She is not diaphoretic.  Psychiatric: She has a normal mood and affect. Her behavior is normal.          Assessment & Plan:  HTN stable  Stable blood pressure  yearly monitoring for lipids and renal function on a diuretic and a statin   Subjective:     Theresa Chambers is a 79 y.o. female who presents for Medicare Annual/Subsequent preventive examination.  Preventive Screening-Counseling & Management  Tobacco History  Smoking status  . Former Smoker  Smokeless tobacco  . Not on file    Comment: quit in 1975     Problems Prior to Visit 1.   Current Problems (verified) Patient Active Problem List   Diagnosis Date Noted  . Chronic cough 08/02/2012  . UNS ADVRS EFF UNS RX MEDICINAL&BIOLOGICAL SBSTNC 11/13/2007  . CANDIDIASIS OF SKIN AND NAILS 08/29/2007  . HYPERLIPIDEMIA 02/26/2007  . HYPERTENSION 02/14/2007    Medications Prior to Visit Current Outpatient Prescriptions on File Prior to Visit  Medication Sig Dispense Refill  . ALPRAZolam (XANAX) 0.25 MG tablet Take 1 tablet (0.25 mg total) by mouth at bedtime as needed for sleep. 1-2 every 4-6 hrs as needed  30 tablet  2  . amLODipine (NORVASC) 5 MG tablet take 1/2 tablet by mouth  At bed time  90 tablet  3  . aspirin 81 MG tablet Take 81 mg by mouth daily.        Marland Kitchen atorvastatin (LIPITOR) 20 MG tablet 1 tab on Monday and friday  10 tablet  6  . Calcium Carbonate (CALTRATE 600 PO) Take by mouth daily.        . indapamide (LOZOL) 1.25 MG tablet take 1 tablet by mouth every morning  30 tablet  11  . Multiple Vitamin (MULTIVITAMIN) tablet Take 1 tablet by mouth daily.         No current facility-administered medications on file prior to visit.    Current Medications (verified) Current Outpatient Prescriptions  Medication Sig Dispense Refill  . ALPRAZolam (XANAX) 0.25 MG tablet Take 1 tablet (0.25 mg total) by mouth at bedtime as needed for sleep. 1-2 every 4-6 hrs as needed  30 tablet  2  . amLODipine (NORVASC) 5 MG tablet take 1/2 tablet by mouth  At bed time  90 tablet  3  . aspirin 81 MG tablet Take 81 mg by mouth daily.        Marland Kitchen atorvastatin (LIPITOR) 20 MG tablet 1 tab on Monday and friday  10 tablet  6  . Calcium Carbonate (CALTRATE 600 PO) Take by mouth daily.          . indapamide (LOZOL) 1.25 MG tablet take 1 tablet by mouth every morning  30 tablet  11  . Multiple Vitamin (MULTIVITAMIN) tablet Take 1 tablet by mouth daily.         No current facility-administered medications for this visit.     Allergies (verified) Codeine and Penicillins   PAST HISTORY  Family History Family History  Problem  Relation Age of Onset  . Anuerysm Mother   . Hypertension Father     Social History History  Substance Use Topics  . Smoking status: Former Games developer  . Smokeless tobacco: Not on file     Comment: quit in 1975  . Alcohol Use: Yes     Are there smokers in your home (other than you)? No  Risk Factors Current exercise habits: The patient does not participate in regular exercise at present.  Dietary issues discussed: none   Cardiac risk factors: dyslipidemia, hypertension and sedentary lifestyle.  Depression Screen (Note: if answer to either of the following is "Yes", a more complete depression screening is indicated)   Over the past two weeks, have you felt down, depressed or hopeless? No  Over the past two weeks, have you felt little interest or pleasure in doing things? No  Have you lost interest or pleasure in daily life? No  Do you often feel hopeless? No  Do you cry easily over simple problems? No  Activities of Daily Living In your present state of health, do you have any difficulty performing the following activities?:  Driving? No Managing money?  No Feeding yourself? No Getting from bed to chair? No Climbing a flight of stairs? No Preparing food and eating?: No Bathing or showering? No Getting dressed: No Getting to the toilet? No Using the toilet:No Moving around from place to place: No In the past year have you fallen or had a near fall?:No   Are you sexually active?  No  Do you have more than one partner?  No  Hearing Difficulties: No Do you often ask people to speak up or repeat themselves? No Do you experience ringing  or noises in your ears? No Do you have difficulty understanding soft or whispered voices? No   Do you feel that you have a problem with memory? No  Do you often misplace items? No  Do you feel safe at home?  Yes  Cognitive Testing  Alert? Yes  Normal Appearance?Yes  Oriented to person? Yes  Place? Yes   Time? Yes  Recall of three objects?  Yes  Can perform simple calculations? Yes  Displays appropriate judgment?Yes  Can read the correct time from a watch face?Yes   Advanced Directives have been discussed with the patient? Yes  List the Names of Other Physician/Practitioners you currently use: 1.    Indicate any recent Medical Services you may have received from other than [facility] providers in the past year (date may be approximate).  Immunization History  Administered Date(s) Administered  . Influenza Split 04/19/2011, 04/10/2012  . Influenza Whole 06/12/2007, 05/24/2008, 05/19/2009, 03/24/2010  . Pneumococcal Polysaccharide-23 05/24/2006  . Td 07/24/1997, 08/29/2007  . Tdap 03/10/2011  . Zoster 07/24/2006    Screening Tests Health Maintenance  Topic Date Due  . Influenza Vaccine  02/21/2014  . Colonoscopy  07/23/2017  . Tetanus/tdap  03/09/2021  . Pneumococcal Polysaccharide Vaccine Age 46 And Over  Completed  . Zostavax  Completed    All answers were reviewed with the patient and necessary referrals were made:  Carrie Mew, MD   07/23/2013   History reviewed: allergies, current medications, past family history, past medical history, past social history, past surgical history and problem list  Review of Systems Pertinent items are noted in HPI.    Objective:     Vision by Snellen chart: right eye:20/20, left eye:20/20  Body mass index is 24.14 kg/(m^2). BP 140/80  Pulse 76  Temp(Src)  98.1 F (36.7 C)  Resp 16  Ht 5\' 2"  (1.575 m)  Wt 132 lb (59.875 kg)  BMI 24.14 kg/m2  BP 140/80  Pulse 76  Temp(Src) 98.1 F (36.7 C)  Resp 16  Ht 5\' 2"   (1.575 m)  Wt 132 lb (59.875 kg)  BMI 24.14 kg/m2  General Appearance:    Alert, cooperative, no distress, appears stated age  Head:    Normocephalic, without obvious abnormality, atraumatic  Eyes:    PERRL, conjunctiva/corneas clear, EOM's intact, fundi    benign, both eyes  Ears:    Normal TM's and external ear canals, both ears  Nose:   Nares normal, septum midline, mucosa normal, no drainage    or sinus tenderness  Throat:   Lips, mucosa, and tongue normal; teeth and gums normal  Neck:   Supple, symmetrical, trachea midline, no adenopathy;    thyroid:  no enlargement/tenderness/nodules; no carotid   bruit or JVD  Back:     Symmetric, no curvature, ROM normal, no CVA tenderness  Lungs:     Clear to auscultation bilaterally, respirations unlabored  Chest Wall:    No tenderness or deformity   Heart:    Regular rate and rhythm, S1 and S2 normal, no murmur, rub   or gallop  Breast Exam:    No tenderness, masses, or nipple abnormality  Abdomen:     Soft, non-tender, bowel sounds active all four quadrants,    no masses, no organomegaly  Genitalia:    Normal female without lesion, discharge or tenderness  Rectal:    Normal tone, normal prostate, no masses or tenderness;   guaiac negative stool  Extremities:   Extremities normal, atraumatic, no cyanosis or edema  Pulses:   2+ and symmetric all extremities  Skin:   Skin color, texture, turgor normal, no rashes or lesions  Lymph nodes:   Cervical, supraclavicular, and axillary nodes normal  Neurologic:   CNII-XII intact, normal strength, sensation and reflexes    throughout       Assessment:      This is a routine physical examination for this healthy  Female. Reviewed all health maintenance protocols including mammography colonoscopy bone density and reviewed appropriate screening labs. Her immunization history was reviewed as well as her current medications and allergies refills of her chronic medications were given and the plan for  yearly health maintenance was discussed all orders and referrals were made as appropriate.      Plan:     During the course of the visit the patient was educated and counseled about appropriate screening and preventive services including:    Pneumococcal vaccine   Influenza vaccine  Colorectal cancer screening  Diet review for nutrition referral? Yes ____  Not Indicated __x__   Patient Instructions (the written plan) was given to the patient.  Medicare Attestation I have personally reviewed: The patient's medical and social history Their use of alcohol, tobacco or illicit drugs Their current medications and supplements The patient's functional ability including ADLs,fall risks, home safety risks, cognitive, and hearing and visual impairment Diet and physical activities Evidence for depression or mood disorders  The patient's weight, height, BMI, and visual acuity have been recorded in the chart.  I have made referrals, counseling, and provided education to the patient based on review of the above and I have provided the patient with a written personalized care plan for preventive services.     Carrie Mew, MD   07/23/2013

## 2013-07-23 NOTE — Progress Notes (Signed)
Pre visit review using our clinic review tool, if applicable. No additional management support is needed unless otherwise documented below in the visit note. 

## 2013-07-23 NOTE — Patient Instructions (Signed)
The patient is instructed to continue all medications as prescribed. Schedule followup with check out clerk upon leaving the clinic  

## 2013-08-29 ENCOUNTER — Telehealth: Payer: Self-pay | Admitting: Internal Medicine

## 2013-08-29 NOTE — Telephone Encounter (Signed)
Ok on tetanus and labs wnl

## 2013-08-29 NOTE — Telephone Encounter (Signed)
Patient obtained small cut approximately 1/8 of inch on finger then morning 08/29/2013,  Cut healing well but calling to see when she had her last Tetanus vaccine.  Last Tetanus was Tdap on 03/10/2011 (patient says before she went to [place]).   Also had Labs on 07/23/2013 and has not heard back from results.  Note in EPIC says not viewable by patient.  Would like call back at work -  [phone].

## 2013-12-09 ENCOUNTER — Other Ambulatory Visit: Payer: Self-pay | Admitting: Internal Medicine

## 2014-01-28 ENCOUNTER — Ambulatory Visit: Payer: Medicare Other | Admitting: Internal Medicine

## 2014-01-28 ENCOUNTER — Other Ambulatory Visit: Payer: Self-pay | Admitting: Internal Medicine

## 2014-02-06 ENCOUNTER — Other Ambulatory Visit: Payer: Self-pay | Admitting: Internal Medicine

## 2014-02-11 ENCOUNTER — Telehealth: Payer: Self-pay | Admitting: Internal Medicine

## 2014-02-11 NOTE — Telephone Encounter (Signed)
Pt will be traveling to [place]  And wuld like a rx for ciprofloxacin (CIPRO) 500 MG tablet and something for nausea Pt will schedule w/ Dr Yong Channel when she gets back from her trips

## 2014-02-13 NOTE — Telephone Encounter (Signed)
Ok cipro  500 BID for 7 days and phenergan 25 mg   Number 10 q 6 hour prn

## 2014-02-16 MED ORDER — CIPROFLOXACIN HCL 500 MG PO TABS: 500.0000 mg | ORAL_TABLET | Freq: Two times a day (BID) | ORAL | Status: DC

## 2014-02-16 MED ORDER — PROMETHAZINE HCL 25 MG PO TABS: 25.0000 mg | ORAL_TABLET | Freq: Four times a day (QID) | ORAL | Status: DC | PRN

## 2014-02-16 NOTE — Telephone Encounter (Signed)
rx sent in electronically 

## 2014-05-22 DIAGNOSIS — H52203 Unspecified astigmatism, bilateral: Secondary | ICD-10-CM | POA: Diagnosis not present

## 2014-05-22 DIAGNOSIS — H2513 Age-related nuclear cataract, bilateral: Secondary | ICD-10-CM | POA: Diagnosis not present

## 2014-05-22 DIAGNOSIS — H40013 Open angle with borderline findings, low risk, bilateral: Secondary | ICD-10-CM | POA: Diagnosis not present

## 2014-07-23 ENCOUNTER — Ambulatory Visit: Payer: Medicare Other | Admitting: Family Medicine

## 2014-07-28 ENCOUNTER — Other Ambulatory Visit: Payer: Self-pay

## 2014-07-28 MED ORDER — ATORVASTATIN CALCIUM 20 MG PO TABS: ORAL_TABLET | ORAL | Status: DC

## 2014-07-28 NOTE — Telephone Encounter (Signed)
Rx request for atorvastatin 20 mg tablet-- take 1 tablet by mouth every Monday and Friday.  Rx sent to pharmacy.

## 2014-08-06 DIAGNOSIS — Z01419 Encounter for gynecological examination (general) (routine) without abnormal findings: Secondary | ICD-10-CM | POA: Diagnosis not present

## 2014-08-06 DIAGNOSIS — N760 Acute vaginitis: Secondary | ICD-10-CM | POA: Diagnosis not present

## 2014-08-28 ENCOUNTER — Ambulatory Visit (INDEPENDENT_AMBULATORY_CARE_PROVIDER_SITE_OTHER): Payer: Medicare Other | Admitting: Family Medicine

## 2014-08-28 ENCOUNTER — Encounter: Payer: Self-pay | Admitting: Family Medicine

## 2014-08-28 VITALS — BP 152/90 | Temp 98.3°F | Wt 137.0 lb

## 2014-08-28 DIAGNOSIS — Z23 Encounter for immunization: Secondary | ICD-10-CM | POA: Diagnosis not present

## 2014-08-28 DIAGNOSIS — E785 Hyperlipidemia, unspecified: Secondary | ICD-10-CM | POA: Diagnosis not present

## 2014-08-28 DIAGNOSIS — I10 Essential (primary) hypertension: Secondary | ICD-10-CM | POA: Diagnosis not present

## 2014-08-28 MED ORDER — AMLODIPINE BESYLATE 5 MG PO TABS: ORAL_TABLET | ORAL | Status: DC

## 2014-08-28 NOTE — Assessment & Plan Note (Signed)
controlled on atorvastatin 20mg  twice a week previously. Plan for fasting lipids on 2 week follow up.

## 2014-08-28 NOTE — Assessment & Plan Note (Signed)
poor control diastolic on amlodipine 2.5mg  and indapamide 1.25mg . Increase amlodipine to 5mg . Follow up in 2-3 weeks. Would tolerate SBP <150 but definitely want DBP below 90 and would like SBP below 140.

## 2014-08-28 NOTE — Patient Instructions (Addendum)
Prevnar today (final pneumonia)  See me in 2-3 weeks for a blood pressure recheck. If you make a morning appointment and come in fasting, we will update your bloodwork.

## 2014-08-28 NOTE — Progress Notes (Signed)
Theresa Reddish, MD Phone: [phone]  Subjective:  Patient presents today to establish care with me as their new primary care provider. Patient was formerly a patient of Dr. Arnoldo Morale. Chief complaint-noted.   Hypertension-poor control on amlodipine 2.5mg  and indapamide 1.25mg   BP Readings from Last 3 Encounters:  08/28/14 152/90  07/23/13 140/80  01/20/13 146/80  Home BP monitoring-130s to 140s over 80s on vast majority of blood pressures.  Compliant with medications-yes without side effects ROS-Denies any CP, HA, SOB, blurry vision. Slight LE edema.   Hyperlipidemia-controlled on atorvastatin 20mg  twice a week  Lab Results  Component Value Date   LDLCALC 86 07/23/2013  Regular exercise: regular walking Diet: eats well ROS- no chest pain or shortness of breath. No myalgias  The following were reviewed and entered/updated in epic: Past Medical History  Diagnosis Date  . Hypertension   . Hyperlipidemia   . Osteopenia   . History of lumpectomy     benign about 40 years ago in 2016   Patient Active Problem List   Diagnosis Date Noted  . Hyperlipidemia 02/26/2007    Priority: Medium  . Essential hypertension 02/14/2007    Priority: Medium   Past Surgical History  Procedure Laterality Date  . Tonsillectomy    . Breast lumpectomy      Family History  Problem Relation Age of Onset  . AAA (abdominal aortic aneurysm) Mother     smoker  . Hypertension Mother     Medications- reviewed and updated Current Outpatient Prescriptions  Medication Sig Dispense Refill  . amLODipine (NORVASC) 5 MG tablet take 1/2 tablet by mouth  At bed time 90 tablet 3  . aspirin 81 MG tablet Take 81 mg by mouth daily.      Marland Kitchen atorvastatin (LIPITOR) 20 MG tablet take 1 tablet by mouth EVERY MONDAY AND FRIDAY 10 tablet 6  . Calcium Carbonate (CALTRATE 600 PO) Take by mouth daily.      . indapamide (LOZOL) 1.25 MG tablet take 1 tablet by mouth every morning 30 tablet 11  . Multiple Vitamin  (MULTIVITAMIN) tablet Take 1 tablet by mouth daily.       Allergies-reviewed and updated Allergies  Allergen Reactions  . Codeine   . Penicillins     History   Social History  . Marital Status: Married    Spouse Name: N/A    Number of Children: N/A  . Years of Education: N/A   Social History Main Topics  . Smoking status: Former Smoker -- 0.10 packs/day for 20 years    Types: Cigarettes    Quit date: 07/24/1973  . Smokeless tobacco: Not on file     Comment: quit in 1975  . Alcohol Use: 1.8 oz/week    3 Not specified per week  . Drug Use: No  . Sexual Activity: Not Currently   Other Topics Concern  . Not on file   Social History Narrative   Widowed 2008 due to alzheimers. Lives alone. 2 children. 3 grandchildren (all went to [facility])   Completely independent. 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       Work 3 days a week at [employer].       Hobbies: goes out 3 days a week, goes to [employer], plays cards, goes to movies, walks, church          ROS--See HPI   Objective: BP 152/90 mmHg  Temp(Src) 98.3 F (36.8 C)  Wt 137 lb (62.143 kg) Gen: NAD, resting comfortably, appears  younger than stated age CV: RRR no murmurs rubs or gallops Lungs: CTAB no crackles, wheeze, rhonchi Abdomen: soft/nontender/nondistended/normal bowel sounds.  Ext: trace edema Skin: warm, dry, no rash  Neuro: grossly normal, moves all extremities   Assessment/Plan:  Hyperlipidemia controlled on atorvastatin 20mg  twice a week previously. Plan for fasting lipids on 2 week follow up.    Essential hypertension poor control diastolic on amlodipine 2.5mg  and indapamide 1.25mg . Increase amlodipine to 5mg . Follow up in 2-3 weeks. Would tolerate SBP <150 but definitely want DBP below 90 and would like SBP below 140.     Return precautions advised. 2-3 week follow up. Fasting labs at that time HTN- CBC, CMET HLD- TSH, Lipids  Orders Placed This Encounter  Procedures  . Pneumococcal  conjugate vaccine 13-valent   Meds ordered this encounter  Medications  . amLODipine (NORVASC) 5 MG tablet    Sig: take 1 tablet by mouth  At bed time    Dispense:  90 tablet    Refill:  3

## 2014-08-31 ENCOUNTER — Telehealth: Payer: Self-pay | Admitting: Family Medicine

## 2014-08-31 ENCOUNTER — Encounter: Payer: Self-pay | Admitting: Family Medicine

## 2014-08-31 ENCOUNTER — Ambulatory Visit (INDEPENDENT_AMBULATORY_CARE_PROVIDER_SITE_OTHER): Payer: Medicare Other | Admitting: Family Medicine

## 2014-08-31 VITALS — BP 140/80 | Temp 98.2°F | Wt 136.0 lb

## 2014-08-31 DIAGNOSIS — I10 Essential (primary) hypertension: Secondary | ICD-10-CM

## 2014-08-31 DIAGNOSIS — T881XXA Other complications following immunization, not elsewhere classified, initial encounter: Secondary | ICD-10-CM

## 2014-08-31 NOTE — Assessment & Plan Note (Signed)
Diastolic at goal on repeat today. SBP very mild elevation. Can cancel 2 week follow up planned. 6 month follow up and get fasting labs at that time. Continue Amlodipine 5mg  , indapamide 1.25mg  in AM

## 2014-08-31 NOTE — Telephone Encounter (Signed)
Pt currently in office waiting to be seen by Dr. Yong Channel.

## 2014-08-31 NOTE — Patient Instructions (Signed)
Blood pressure looks great, no need to come back for 2-3 week repeat. We can see each other in 6 months for morning appointment and update fasting labs at that time.   This looks like a local reaction to the immunization. It does not look infected at present. I would ice your arm 3x a day for 15 minutes. Bag of peas would be fine. 2 aspirin a day is fine.   Cortisone cream would be fine for the itch.   Warning signs for return: fever, expanding redness, new onset pain, no resolution within 2 weeks

## 2014-08-31 NOTE — Telephone Encounter (Signed)
Patient Name: Theresa Chambers  DOB: 10-Jul-1933    Initial Comment Caller states, had a pneumonia shot on Friday, on her Arm. Today there is a reaction, it is itchy the size of a saucer, and feels hot    Nurse Assessment  Nurse: Wynetta Emery, RN, Baker Janus Date/Time Theresa Chambers Time): 08/31/2014 9:45:20 AM  Confirm and document reason for call. If symptomatic, describe symptoms. ---[name] had the pneumonia shot on Friday and Saturday night site started itching and red and large as a saucer. taking a baby ASA ever 4 hours.  Has the patient traveled out of the country within the last 30 days? ---No  Does the patient require triage? ---Yes  Related visit to physician within the last 2 weeks? ---Yes   MD office Friday Pneumonia shot  Does the PT have any chronic conditions? (i.e. diabetes, asthma, etc.) ---Unknown     Guidelines    Guideline Title Affirmed Question Affirmed Notes  Immunization Reactions [1] Redness or red streak around the injection site AND [2] begins > 48 hours after shot AND [3] fever    Final Disposition User   See Physician within 4 Hours (or PCP triage) Wynetta Emery, RN, Baker Janus    Comments  ATTN: appt made 2/8-16 215pm w/ Dr. Garret Reddish

## 2014-08-31 NOTE — Progress Notes (Signed)
  Garret Reddish, MD Phone: [phone]  Subjective:   Theresa Chambers is a 81 y.o. year old very pleasant female patient who presents with the following:  Local reaction to prevnar Prevnar Friday. Started with R arm swelling wthin a few hours. Mild pain Saturday which resolved and has since had itching. ASA 81 mg twice a day helps with itching. Since Saturday, has been stable in size but not grown at all  ROS- no fever/chills/nausea/vomiting  Hypertension-improved control  BP Readings from Last 3 Encounters:  08/31/14 140/80  08/28/14 152/90  07/23/13 140/80  Compliant with medications-yes without side effects ROS-Denies any CP, HA, SOB, blurry vision, LE edema.   Past Medical History- Patient Active Problem List   Diagnosis Date Noted  . Hyperlipidemia 02/26/2007    Priority: Medium  . Essential hypertension 02/14/2007    Priority: Medium   Medications- reviewed and updated Current Outpatient Prescriptions  Medication Sig Dispense Refill  . amLODipine (NORVASC) 5 MG tablet take 1 tablet by mouth  At bed time 90 tablet 3  . aspirin 81 MG tablet Take 81 mg by mouth daily.      Marland Kitchen atorvastatin (LIPITOR) 20 MG tablet take 1 tablet by mouth EVERY MONDAY AND FRIDAY 10 tablet 6  . Calcium Carbonate (CALTRATE 600 PO) Take by mouth daily.      . indapamide (LOZOL) 1.25 MG tablet take 1 tablet by mouth every morning 30 tablet 11  . Multiple Vitamin (MULTIVITAMIN) tablet Take 1 tablet by mouth daily.       No current facility-administered medications for this visit.    Objective: BP 140/80 mmHg  Temp(Src) 98.2 F (36.8 C)  Wt 136 lb (61.689 kg) Gen: NAD, resting comfortably No lip or tongue swelling CV: RRR no murmurs rubs or gallops Lungs: CTAB no crackles, wheeze, rhonchi Ext:   Right arm with approximately 8 x 20 cm raised erythematous,warm lesion around injection side-wrapping around to medial arm.    Assessment/Plan:  Essential hypertension Diastolic at goal on  repeat today. SBP very mild elevation. Can cancel 2 week follow up planned. 6 month follow up and get fasting labs at that time. Continue Amlodipine 5mg  , indapamide 1.25mg  in AM   Local reaction to prevnar Advised icing 3x a day. Does not seem to be infected given no increase from initial swelling, no pain. Cortisone cream for itch as well. No systemic symptoms. Can continue aspirin twice a day. Discussed if stable on Thursday, Friday, could use prednisone 40,40,20,10, 10.   Return precautions advised.

## 2014-09-17 ENCOUNTER — Ambulatory Visit: Payer: Medicare Other | Admitting: Family Medicine

## 2015-02-23 ENCOUNTER — Other Ambulatory Visit: Payer: Self-pay | Admitting: *Deleted

## 2015-02-23 MED ORDER — INDAPAMIDE 1.25 MG PO TABS: 1.2500 mg | ORAL_TABLET | Freq: Every morning | ORAL | Status: DC

## 2015-03-03 ENCOUNTER — Ambulatory Visit: Payer: Medicare Other | Admitting: Family Medicine

## 2015-03-12 ENCOUNTER — Encounter: Payer: Self-pay | Admitting: Family Medicine

## 2015-03-12 ENCOUNTER — Ambulatory Visit (INDEPENDENT_AMBULATORY_CARE_PROVIDER_SITE_OTHER): Payer: Medicare Other | Admitting: Family Medicine

## 2015-03-12 VITALS — BP 146/88 | HR 80 | Temp 99.1°F | Wt 135.0 lb

## 2015-03-12 DIAGNOSIS — I10 Essential (primary) hypertension: Secondary | ICD-10-CM | POA: Diagnosis not present

## 2015-03-12 DIAGNOSIS — E785 Hyperlipidemia, unspecified: Secondary | ICD-10-CM | POA: Diagnosis not present

## 2015-03-12 NOTE — Progress Notes (Signed)
Garret Reddish, MD  Subjective:  Theresa Chambers is a 81 y.o. year old very pleasant female patient who presents with:  See problem oriented charting ROS- no chest pain, shortness of breath, blurry vision, headache, lightheadedness  Past Medical History- HTN, HLD  Medications- reviewed and updated Current Outpatient Prescriptions  Medication Sig Dispense Refill  . amLODipine (NORVASC) 5 MG tablet take 1 tablet by mouth  At bed time 90 tablet 3  . aspirin 81 MG tablet Take 81 mg by mouth daily.      Marland Kitchen atorvastatin (LIPITOR) 20 MG tablet take 1 tablet by mouth EVERY MONDAY AND FRIDAY 10 tablet 6  . Calcium Carbonate (CALTRATE 600 PO) Take by mouth daily.      . indapamide (LOZOL) 1.25 MG tablet Take 1 tablet (1.25 mg total) by mouth every morning. 30 tablet 11  . Multiple Vitamin (MULTIVITAMIN) tablet Take 1 tablet by mouth daily.       Objective: BP 146/88 mmHg  Pulse 80  Temp(Src) 99.1 F (37.3 C)  Wt 135 lb (61.236 kg) Gen: NAD, resting comfortably CV: RRR no murmurs rubs or gallops Lungs: CTAB no crackles, wheeze, rhonchi Abdomen: soft/nontender/nondistended/normal bowel sounds. No rebound or guarding.  Ext: no edema Skin: warm, dry Neuro: grossly normal, moves all extremities  Assessment/Plan:  Hyperlipidemia S: controlled on previous values. Primary prevention benefit unclear honestly. Atorvastatin M,F 20mg , uses aspirin.  A/P:Continue current meds. Check LDL goal <100 but would not likely increase statin dose    Essential hypertension S: controlled per JNC 8 for age, home checks 110-120, controlled DBP A/P:Continue current meds:  Amlodipine 5mg  , indapamide 1.25mg  in AM. 3 month follow up, would prefer <140 despite JNC8 goals. Bring home cuff- suspect she is closer to 120 and increase at visit anxiety related    Future fasting  Orders Placed This Encounter  Procedures  . CBC    [place]    Standing Status: Future     Number of Occurrences:      Standing  Expiration Date: 03/11/2016  . Comprehensive metabolic panel    [place]    Standing Status: Future     Number of Occurrences:      Standing Expiration Date: 03/11/2016    Order Specific Question:  Has the patient fasted?    Answer:  No  . Lipid panel    [place]    Standing Status: Future     Number of Occurrences:      Standing Expiration Date: 03/11/2016    Order Specific Question:  Has the patient fasted?    Answer:  No  . TSH    [place]    Standing Status: Future     Number of Occurrences:      Standing Expiration Date: 03/11/2016

## 2015-03-12 NOTE — Assessment & Plan Note (Signed)
S: controlled per JNC 8 for age, home checks 110-120, controlled DBP A/P:Continue current meds:  Amlodipine 5mg  , indapamide 1.25mg  in AM. 3 month follow up, would prefer <140 despite JNC8 goals. Bring home cuff- suspect she is closer to 120 and increase at visit anxiety related

## 2015-03-12 NOTE — Assessment & Plan Note (Signed)
S: controlled on previous values. Primary prevention benefit unclear honestly. Atorvastatin M,F 20mg , uses aspirin.  A/P:Continue current meds. Check LDL goal <100 but would not likely increase statin dose

## 2015-03-12 NOTE — Patient Instructions (Signed)
Medication Instructions:  Same, see list  Labwork: Schedule a lab visit at the front desk. Return for future fasting labs. Nothing but water after midnight please.   Testing/Procedures/Immunizations: Flu shot in September or october  Follow-Up (all visit scheduling, rescheduling, cancellations including labs should be scheduled at front desk): 3 months to follow up blood pressure, bring a log of your blood pressures (check once a week) as well as your cuff with you. If above 150 on top number, see me sooner

## 2015-03-18 ENCOUNTER — Other Ambulatory Visit (INDEPENDENT_AMBULATORY_CARE_PROVIDER_SITE_OTHER): Payer: Medicare Other

## 2015-03-18 DIAGNOSIS — E785 Hyperlipidemia, unspecified: Secondary | ICD-10-CM | POA: Diagnosis not present

## 2015-03-18 DIAGNOSIS — I10 Essential (primary) hypertension: Secondary | ICD-10-CM

## 2015-03-18 LAB — COMPREHENSIVE METABOLIC PANEL
ALT: 17 U/L (ref 0–35)
AST: 17 U/L (ref 0–37)
Albumin: 4.5 g/dL (ref 3.5–5.2)
Alkaline Phosphatase: 37 U/L — ABNORMAL LOW (ref 39–117)
BUN: 16 mg/dL (ref 6–23)
CO2: 32 mEq/L (ref 19–32)
Calcium: 10.4 mg/dL (ref 8.4–10.5)
Chloride: 101 mEq/L (ref 96–112)
Creatinine, Ser: 0.73 mg/dL (ref 0.40–1.20)
GFR: 81.51 mL/min (ref >60.00)
Glucose, Bld: 100 mg/dL — ABNORMAL HIGH (ref 70–99)
Potassium: 4.7 mEq/L (ref 3.5–5.1)
Sodium: 141 mEq/L (ref 135–145)
Total Bilirubin: 1 mg/dL (ref 0.2–1.2)
Total Protein: 7.2 g/dL (ref 6.0–8.3)

## 2015-03-18 LAB — CBC
HCT: 41.2 % (ref 36.0–46.0)
Hemoglobin: 13.9 g/dL (ref 12.0–15.0)
MCHC: 33.8 g/dL (ref 30.0–36.0)
MCV: 90.3 fl (ref 78.0–100.0)
Platelets: 236 10*3/uL (ref 150.0–400.0)
RBC: 4.56 Mil/uL (ref 3.87–5.11)
RDW: 13.9 % (ref 11.5–15.5)
WBC: 4.4 10*3/uL (ref 4.0–10.5)

## 2015-03-18 LAB — LIPID PANEL
CHOL/HDL RATIO: 3
Cholesterol: 177 mg/dL (ref 0–200)
HDL: 69.8 mg/dL (ref >39.00)
LDL Cholesterol: 93 mg/dL (ref 0–99)
NONHDL: 107.02
TRIGLYCERIDES: 68 mg/dL (ref 0.0–149.0)
VLDL: 13.6 mg/dL (ref 0.0–40.0)

## 2015-03-18 LAB — TSH: TSH: 2.04 u[IU]/mL (ref 0.35–4.50)

## 2015-04-18 ENCOUNTER — Other Ambulatory Visit: Payer: Self-pay | Admitting: Family Medicine

## 2015-05-25 DIAGNOSIS — H40053 Ocular hypertension, bilateral: Secondary | ICD-10-CM | POA: Diagnosis not present

## 2015-05-25 DIAGNOSIS — H40013 Open angle with borderline findings, low risk, bilateral: Secondary | ICD-10-CM | POA: Diagnosis not present

## 2015-05-25 DIAGNOSIS — H5203 Hypermetropia, bilateral: Secondary | ICD-10-CM | POA: Diagnosis not present

## 2015-05-26 DIAGNOSIS — D485 Neoplasm of uncertain behavior of skin: Secondary | ICD-10-CM | POA: Diagnosis not present

## 2015-05-26 DIAGNOSIS — C44729 Squamous cell carcinoma of skin of left lower limb, including hip: Secondary | ICD-10-CM | POA: Diagnosis not present

## 2015-05-26 DIAGNOSIS — L821 Other seborrheic keratosis: Secondary | ICD-10-CM | POA: Diagnosis not present

## 2015-05-26 DIAGNOSIS — L918 Other hypertrophic disorders of the skin: Secondary | ICD-10-CM | POA: Diagnosis not present

## 2015-06-08 ENCOUNTER — Encounter: Payer: Self-pay | Admitting: Family Medicine

## 2015-06-08 DIAGNOSIS — C4492 Squamous cell carcinoma of skin, unspecified: Secondary | ICD-10-CM | POA: Insufficient documentation

## 2015-06-23 ENCOUNTER — Ambulatory Visit: Payer: Medicare Other | Admitting: Family Medicine

## 2015-07-28 ENCOUNTER — Ambulatory Visit (INDEPENDENT_AMBULATORY_CARE_PROVIDER_SITE_OTHER): Payer: Medicare Other | Admitting: Family Medicine

## 2015-07-28 ENCOUNTER — Encounter: Payer: Self-pay | Admitting: Family Medicine

## 2015-07-28 VITALS — BP 142/80 | HR 71 | Temp 98.0°F | Wt 141.0 lb

## 2015-07-28 DIAGNOSIS — I10 Essential (primary) hypertension: Secondary | ICD-10-CM

## 2015-07-28 DIAGNOSIS — E785 Hyperlipidemia, unspecified: Secondary | ICD-10-CM

## 2015-07-28 NOTE — Assessment & Plan Note (Signed)
S: controlled on home readings with Amlodipine 5mg  , indapamide 1.25mg  in AM with typical readings 130/80. Controlled in office per [facility] goals as well. My reading of BP 152/88 compares to 153/88 on home cuff and cuff therefore verifies BP Readings from Last 3 Encounters:  07/28/15 142/80  03/12/15 146/88  08/31/14 140/80  A/P:Continue current meds:  We discussed checking once a week from now on and keeping a log- if >140/90 see me sooner, otherwise see me early september

## 2015-07-28 NOTE — Patient Instructions (Addendum)
Blood pressure looks great at home  Cholesterol looks great as well  Weight up slightly- get back into the walking but really just want to maintain weight.   See me early September for an annual wellness visit and we will get your "travel pack" together at that time

## 2015-07-28 NOTE — Assessment & Plan Note (Signed)
S: well controlled on atorvastatin M,F with LDL <100. No myalgias.  Weight is up 6 lbs over holidays- lots of peppermint ice cream and less walking Lab Results  Component Value Date   CHOL 177 03/18/2015   HDL 69.80 03/18/2015   LDLCALC 93 03/18/2015   LDLDIRECT 171.0 01/20/2013   TRIG 68.0 03/18/2015   CHOLHDL 3 03/18/2015   A/P: continue current rx, encouraged restarting walking and goal to keep 140 peak weight.

## 2015-07-28 NOTE — Progress Notes (Signed)
Garret Reddish, MD  Subjective:  Theresa Chambers is a 82 y.o. year old very pleasant female patient who presents for/with See problem oriented charting ROS- No chest pain or shortness of breath. No headache or blurry vision.   Past Medical History-  Patient Active Problem List   Diagnosis Date Noted  . Hyperlipidemia 02/26/2007    Priority: Medium  . Essential hypertension 02/14/2007    Priority: Medium  . Squamous cell carcinoma of skin 06/08/2015    Medications- reviewed and updated Current Outpatient Prescriptions  Medication Sig Dispense Refill  . amLODipine (NORVASC) 5 MG tablet take 1 tablet by mouth  At bed time 90 tablet 3  . aspirin 81 MG tablet Take 81 mg by mouth daily.      Marland Kitchen atorvastatin (LIPITOR) 20 MG tablet take 1 tablet by mouth EVERY MONDAY AND FRIDAY 20 tablet 3  . Calcium Carbonate (CALTRATE 600 PO) Take by mouth daily.      . indapamide (LOZOL) 1.25 MG tablet Take 1 tablet (1.25 mg total) by mouth every morning. 30 tablet 11  . Multiple Vitamin (MULTIVITAMIN) tablet Take 1 tablet by mouth daily.       No current facility-administered medications for this visit.    Objective: BP 142/80 mmHg  Pulse 71  Temp(Src) 98 F (36.7 C)  Wt 141 lb (63.957 kg) Gen: NAD, resting comfortably CV: RRR no murmurs rubs or gallops Lungs: CTAB no crackles, wheeze, rhonchi Abdomen: soft/nontender/nondistended/normal bowel sounds. No rebound or guarding.  Ext: no edema Skin: warm, dry Neuro: grossly normal, moves all extremities  Assessment/Plan:  Essential hypertension S: controlled on home readings with Amlodipine 5mg  , indapamide 1.25mg  in AM with typical readings 130/80. Controlled in office per [facility] goals as well. My reading of BP 152/88 compares to 153/88 on home cuff and cuff therefore verifies BP Readings from Last 3 Encounters:  07/28/15 142/80  03/12/15 146/88  08/31/14 140/80  A/P:Continue current meds:  We discussed checking once a week from now on and  keeping a log- if >140/90 see me sooner, otherwise see me early september   Hyperlipidemia S: well controlled on atorvastatin M,F with LDL <100. No myalgias.  Weight is up 6 lbs over holidays- lots of peppermint ice cream and less walking Lab Results  Component Value Date   CHOL 177 03/18/2015   HDL 69.80 03/18/2015   LDLCALC 93 03/18/2015   LDLDIRECT 171.0 01/20/2013   TRIG 68.0 03/18/2015   CHOLHDL 3 03/18/2015   A/P: continue current rx, encouraged restarting walking and goal to keep 140 peak weight.     September for AWV and update bloodwork Requests "travel pack" at that time including for [place]- cipro, zofran, xanax #3. i agreed to this.

## 2015-09-23 ENCOUNTER — Other Ambulatory Visit: Payer: Self-pay | Admitting: Family Medicine

## 2015-09-23 DIAGNOSIS — I10 Essential (primary) hypertension: Secondary | ICD-10-CM

## 2015-09-23 MED ORDER — AMLODIPINE BESYLATE 5 MG PO TABS: ORAL_TABLET | ORAL | Status: DC

## 2016-02-17 ENCOUNTER — Other Ambulatory Visit: Payer: Self-pay | Admitting: Family Medicine

## 2016-02-17 NOTE — Telephone Encounter (Signed)
Rx refill sent to pharmacy. 

## 2016-03-22 ENCOUNTER — Other Ambulatory Visit: Payer: Self-pay | Admitting: *Deleted

## 2016-03-22 MED ORDER — ATORVASTATIN CALCIUM 20 MG PO TABS: ORAL_TABLET | ORAL | 3 refills | Status: DC

## 2016-04-05 ENCOUNTER — Encounter: Payer: Self-pay | Admitting: Family Medicine

## 2016-04-05 ENCOUNTER — Ambulatory Visit (INDEPENDENT_AMBULATORY_CARE_PROVIDER_SITE_OTHER): Payer: Medicare Other | Admitting: Family Medicine

## 2016-04-05 VITALS — BP 148/86 | HR 77 | Temp 98.1°F | Ht 61.5 in | Wt 134.0 lb

## 2016-04-05 DIAGNOSIS — Z0000 Encounter for general adult medical examination without abnormal findings: Secondary | ICD-10-CM | POA: Diagnosis not present

## 2016-04-05 DIAGNOSIS — E785 Hyperlipidemia, unspecified: Secondary | ICD-10-CM | POA: Diagnosis not present

## 2016-04-05 DIAGNOSIS — I10 Essential (primary) hypertension: Secondary | ICD-10-CM | POA: Diagnosis not present

## 2016-04-05 LAB — LIPID PANEL
CHOLESTEROL: 194 mg/dL (ref 0–200)
HDL: 70.2 mg/dL (ref >39.00)
LDL CALC: 109 mg/dL — AB (ref 0–99)
NonHDL: 123.98
TRIGLYCERIDES: 77 mg/dL (ref 0.0–149.0)
Total CHOL/HDL Ratio: 3
VLDL: 15.4 mg/dL (ref 0.0–40.0)

## 2016-04-05 LAB — COMPREHENSIVE METABOLIC PANEL
ALBUMIN: 4.7 g/dL (ref 3.5–5.2)
ALK PHOS: 38 U/L — AB (ref 39–117)
ALT: 13 U/L (ref 0–35)
AST: 15 U/L (ref 0–37)
BUN: 20 mg/dL (ref 6–23)
CALCIUM: 9.8 mg/dL (ref 8.4–10.5)
CHLORIDE: 101 meq/L (ref 96–112)
CO2: 24 mEq/L (ref 19–32)
Creatinine, Ser: 0.7 mg/dL (ref 0.40–1.20)
GFR: 85.32 mL/min (ref >60.00)
Glucose, Bld: 95 mg/dL (ref 70–99)
POTASSIUM: 3.6 meq/L (ref 3.5–5.1)
SODIUM: 140 meq/L (ref 135–145)
TOTAL PROTEIN: 7.5 g/dL (ref 6.0–8.3)
Total Bilirubin: 1 mg/dL (ref 0.2–1.2)

## 2016-04-05 LAB — CBC
HEMATOCRIT: 41.7 % (ref 36.0–46.0)
HEMOGLOBIN: 14.1 g/dL (ref 12.0–15.0)
MCHC: 33.8 g/dL (ref 30.0–36.0)
MCV: 89.5 fl (ref 78.0–100.0)
PLATELETS: 222 10*3/uL (ref 150.0–400.0)
RBC: 4.66 Mil/uL (ref 3.87–5.11)
RDW: 13.6 % (ref 11.5–15.5)
WBC: 4.2 10*3/uL (ref 4.0–10.5)

## 2016-04-05 MED ORDER — CIPROFLOXACIN HCL 500 MG PO TABS: 500.0000 mg | ORAL_TABLET | Freq: Two times a day (BID) | ORAL | 0 refills | Status: DC

## 2016-04-05 MED ORDER — ATORVASTATIN CALCIUM 20 MG PO TABS: ORAL_TABLET | ORAL | 3 refills | Status: DC

## 2016-04-05 MED ORDER — ALPRAZOLAM 0.25 MG PO TABS: ORAL_TABLET | ORAL | 0 refills | Status: DC

## 2016-04-05 MED ORDER — PROMETHAZINE HCL 25 MG PO TABS: 25.0000 mg | ORAL_TABLET | Freq: Four times a day (QID) | ORAL | 0 refills | Status: DC | PRN

## 2016-04-05 MED ORDER — AMLODIPINE BESYLATE 5 MG PO TABS: ORAL_TABLET | ORAL | 3 refills | Status: AC

## 2016-04-05 NOTE — Patient Instructions (Addendum)
Come by for flu shot in October or so  Travel kit set up

## 2016-04-05 NOTE — Assessment & Plan Note (Signed)
S: controlled on home readings at usually in high 130s, yesterday morning was [id number], diastolic in [id number]. . Taking amlodipine 5mg  and indapamide 1.25mg  BP Readings from Last 3 Encounters:  04/05/16 (!) 148/86  07/28/15 (!) 142/80  03/12/15 (!) 146/88  A/P:Continue current meds:  Well controlled

## 2016-04-05 NOTE — Progress Notes (Signed)
Pre visit review using our clinic review tool, if applicable. No additional management support is needed unless otherwise documented below in the visit note. 

## 2016-04-05 NOTE — Progress Notes (Signed)
Phone: [phone]  Subjective:  Patient presents today for their annual physical. Chief complaint-noted.   See problem oriented charting- ROS- full  review of systems was completed and negative including No chest pain or shortness of breath. No headache or blurry vision.   The following were reviewed and entered/updated in epic: Past Medical History:  Diagnosis Date  . History of lumpectomy    benign about 40 years ago in 2016  . Hyperlipidemia   . Hypertension   . Osteopenia    Patient Active Problem List   Diagnosis Date Noted  . Hyperlipidemia 02/26/2007    Priority: Medium  . Essential hypertension 02/14/2007    Priority: Medium  . Squamous cell carcinoma of skin 06/08/2015   Past Surgical History:  Procedure Laterality Date  . BREAST LUMPECTOMY    . TONSILLECTOMY      Family History  Problem Relation Age of Onset  . AAA (abdominal aortic aneurysm) Mother     smoker  . Hypertension Mother     Medications- reviewed and updated Current Outpatient Prescriptions  Medication Sig Dispense Refill  . amLODipine (NORVASC) 5 MG tablet take 1 tablet by mouth  At bed time 90 tablet 3  . aspirin 81 MG tablet Take 81 mg by mouth daily.      Marland Kitchen atorvastatin (LIPITOR) 20 MG tablet take 1 tablet by mouth EVERY MONDAY AND FRIDAY 20 tablet 3  . Calcium Carbonate (CALTRATE 600 PO) Take by mouth daily.      . indapamide (LOZOL) 1.25 MG tablet take 1 tablet by mouth every morning 30 tablet 2  . Multiple Vitamin (MULTIVITAMIN) tablet Take 1 tablet by mouth daily.      Marland Kitchen ALPRAZolam (XANAX) 0.25 MG tablet take 1 tablet by mouth at bedtime if needed for sleep or every 4 to 6 hours if needed 20 tablet 0  . ciprofloxacin (CIPRO) 500 MG tablet Take 1 tablet (500 mg total) by mouth 2 (two) times daily. For travelers diarrhea- do not take if blood in stool 6 tablet 0  . promethazine (PHENERGAN) 25 MG tablet Take 1 tablet (25 mg total) by mouth every 6 (six) hours as needed for nausea or  vomiting. 20 tablet 0   No current facility-administered medications for this visit.     Allergies-reviewed and updated Allergies  Allergen Reactions  . Codeine   . Penicillins     Social History   Social History  . Marital status: Married    Spouse name: N/A  . Number of children: N/A  . Years of education: N/A   Social History Main Topics  . Smoking status: Former Smoker    Packs/day: 0.10    Years: 20.00    Types: Cigarettes    Quit date: 07/24/1973  . Smokeless tobacco: None     Comment: quit in 1975  . Alcohol use 1.8 oz/week    3 Standard drinks or equivalent per week  . Drug use: No  . Sexual activity: Not Currently   Other Topics Concern  . None   Social History Narrative   Widowed 2008 due to [employer]. Lives alone. 2 children. 3 grandchildren (all went to [facility])   Completely independent. 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       Work 3 days a week at [employer].       Hobbies: goes out 3 days a week, goes to [employer], plays cards, goes to movies, walks, church  Objective: BP (!) 148/86   Pulse 77   Temp 98.1 F (36.7 C) (Oral)   Ht 5' 1.5" (1.562 m)   Wt 134 lb (60.8 kg)   SpO2 98%   BMI 24.91 kg/m  Gen: NAD, resting comfortably HEENT: Mucous membranes are moist. Oropharynx normal Neck: no thyromegaly CV: RRR no murmurs rubs or gallops Lungs: CTAB no crackles, wheeze, rhonchi Abdomen: soft/nontender/nondistended/normal bowel sounds. No rebound or guarding.  Ext: no edema Skin: warm, dry Neuro: grossly normal, moves all extremities, PERRLA  Assessment/Plan:  82 y.o. female presenting for annual physical.  Health Maintenance counseling: 1. Anticipatory guidance: Patient counseled regarding regular dental exams, eye exams, wearing seatbelts.  2. Risk factor reduction:  Advised patient of need for regular exercise and diet rich and fruits and vegetables to reduce risk of heart attack and stroke. Trying to start back up exercising-  hot summer made it tough. Been doing lower carb diet- doing some more meats 3. Immunizations/screenings/ancillary studies Immunization History  Administered Date(s) Administered  . Influenza Split 04/19/2011, 04/10/2012  . Influenza Whole 06/12/2007, 05/24/2008, 05/19/2009, 03/24/2010  . Influenza, High Dose Seasonal PF 05/27/2014  . Influenza-Unspecified 05/31/2015  . Pneumococcal Conjugate-13 08/28/2014  . Pneumococcal Polysaccharide-23 05/24/2006  . Td 07/24/1997, 08/29/2007  . Tdap 03/10/2011  . Zoster 07/24/2006  4. Cervical cancer screening- passed age based screening 5. Breast cancer screening-  mammogram - last done 2014. No longer advised based off age based screening 6. Colon cancer screening - passed age based screening. She states has never had abnormal- last one within 10 years 7. Skin cancer screening- [place] derm Dr. Fontaine No, sees yearly  Status of chronic or acute concerns   HLD S: well controlled on M, F atorvastatin 20mg  last check. No myalgias.  Lab Results  Component Value Date   CHOL 177 03/18/2015   HDL 69.80 03/18/2015   LDLCALC 93 03/18/2015   LDLDIRECT 171.0 01/20/2013   TRIG 68.0 03/18/2015   CHOLHDL 3 03/18/2015   A/P: update lipids today, continue aspirin as well  Essential hypertension S: controlled on home readings at usually in high 130s, yesterday morning was [id number], diastolic in [id number]. . Taking amlodipine 5mg  and indapamide 1.25mg  BP Readings from Last 3 Encounters:  04/05/16 (!) 148/86  07/28/15 (!) 142/80  03/12/15 (!) 146/88  A/P:Continue current meds:  Well controlled   Return in about 6 months (around 10/03/2016) for follow up- or sooner if needed.  Orders Placed This Encounter  Procedures  . CBC    [place]  . Comprehensive metabolic panel    [place]    Order Specific Question:   Has the patient fasted?    Answer:   No  . Lipid panel    [place]    Order Specific Question:   Has the patient fasted?    Answer:   No    Meds  ordered this encounter  Medications  . ALPRAZolam (XANAX) 0.25 MG tablet    Sig: take 1 tablet by mouth at bedtime if needed for sleep or every 4 to 6 hours if needed    Dispense:  20 tablet    Refill:  0  . ciprofloxacin (CIPRO) 500 MG tablet    Sig: Take 1 tablet (500 mg total) by mouth 2 (two) times daily. For travelers diarrhea- do not take if blood in stool    Dispense:  6 tablet    Refill:  0  . promethazine (PHENERGAN) 25 MG tablet    Sig: Take 1 tablet (25  mg total) by mouth every 6 (six) hours as needed for nausea or vomiting.    Dispense:  20 tablet    Refill:  0  . amLODipine (NORVASC) 5 MG tablet    Sig: take 1 tablet by mouth  At bed time    Dispense:  90 tablet    Refill:  3  . atorvastatin (LIPITOR) 20 MG tablet    Sig: take 1 tablet by mouth EVERY MONDAY AND FRIDAY    Dispense:  20 tablet    Refill:  3    Return precautions advised.   Garret Reddish, MD

## 2016-04-07 ENCOUNTER — Telehealth: Payer: Self-pay

## 2016-04-07 NOTE — Telephone Encounter (Signed)
Received PA request from [facility] for Promethazine 25mg  tablets. PA submitted via covermymeds and is pending. [id number]

## 2016-04-10 NOTE — Telephone Encounter (Signed)
Received forms from [facility]. Forms filled out & faxed back.

## 2016-04-25 ENCOUNTER — Telehealth: Payer: Self-pay

## 2016-04-25 NOTE — Telephone Encounter (Signed)
Received PA Approval for Promethazine. Pharmacy aware & will fill for patient.

## 2016-05-02 ENCOUNTER — Ambulatory Visit (INDEPENDENT_AMBULATORY_CARE_PROVIDER_SITE_OTHER): Payer: Medicare Other | Admitting: Family Medicine

## 2016-05-02 ENCOUNTER — Encounter: Payer: Self-pay | Admitting: Family Medicine

## 2016-05-02 VITALS — BP 144/82 | HR 82 | Temp 98.4°F | Wt 132.2 lb

## 2016-05-02 DIAGNOSIS — J0100 Acute maxillary sinusitis, unspecified: Secondary | ICD-10-CM

## 2016-05-02 DIAGNOSIS — I10 Essential (primary) hypertension: Secondary | ICD-10-CM | POA: Diagnosis not present

## 2016-05-02 MED ORDER — AZITHROMYCIN 250 MG PO TABS: ORAL_TABLET | ORAL | 0 refills | Status: DC

## 2016-05-02 NOTE — Progress Notes (Signed)
PCP: Garret Reddish, MD  Subjective:  Theresa Chambers is a 82 y.o. year old very pleasant female patient who presents with sinusitis symptoms including nasal congestion, blowing nose, nonproductive cough. Cough is her main concern along with fatigue -other symptoms include: fatigue -day of illness:10 -Symptoms are improving slightly cough, fatigue not improving -previous treatments: rest, hydration -sick contacts/travel/risks: denies flu exposure. Was on trip and multiple other people got sick- about half.  -Hx of: allergies  ROS-denies fever, SOB, NVD, tooth pain  Pertinent Past Medical History-  Patient Active Problem List   Diagnosis Date Noted  . Hyperlipidemia 02/26/2007    Priority: Medium  . Essential hypertension 02/14/2007    Priority: Medium  . Squamous cell carcinoma of skin 06/08/2015    Medications- reviewed  Current Outpatient Prescriptions  Medication Sig Dispense Refill  . amLODipine (NORVASC) 5 MG tablet take 1 tablet by mouth  At bed time 90 tablet 3  . aspirin 81 MG tablet Take 81 mg by mouth daily.      Marland Kitchen atorvastatin (LIPITOR) 20 MG tablet take 1 tablet by mouth EVERY MONDAY AND FRIDAY 20 tablet 3  . Calcium Carbonate (CALTRATE 600 PO) Take by mouth daily.      . indapamide (LOZOL) 1.25 MG tablet take 1 tablet by mouth every morning 30 tablet 2  . Multiple Vitamin (MULTIVITAMIN) tablet Take 1 tablet by mouth daily.      Marland Kitchen ALPRAZolam (XANAX) 0.25 MG tablet take 1 tablet by mouth at bedtime if needed for sleep or every 4 to 6 hours if needed (Patient not taking: Reported on 05/02/2016) 20 tablet 0  . ciprofloxacin (CIPRO) 500 MG tablet Take 1 tablet (500 mg total) by mouth 2 (two) times daily. For travelers diarrhea- do not take if blood in stool (Patient not taking: Reported on 05/02/2016) 6 tablet 0  . promethazine (PHENERGAN) 25 MG tablet Take 1 tablet (25 mg total) by mouth every 6 (six) hours as needed for nausea or vomiting. (Patient not taking: Reported  on 05/02/2016) 20 tablet 0   No current facility-administered medications for this visit.     Objective: BP (!) 144/82   Pulse 82   Temp 98.4 F (36.9 C) (Oral)   Wt 132 lb 3.2 oz (60 kg)   SpO2 96%   BMI 24.57 kg/m  Gen: NAD, resting comfortably HEENT: Turbinates erythematous with no drainage, TM normal, pharynx mildly erythematous with no tonsilar exudate or edema, minimal sinus tenderness CV: RRR no murmurs rubs or gallops Lungs: CTAB no crackles, wheeze, rhonchi Abdomen: soft/nontender/nondistended/normal bowel sounds. No rebound or guarding.  Ext: no edema Skin: warm, dry, no rash Neuro: grossly normal, moves all extremities  Assessment/Plan:  Sinsusitis Potential bacterial based on: Symptoms of 10 days. Her sinus symptoms and congestion are not primary concern though- really the lingering cough. This may be more of a URI. We opted to give her through the end of week and if not better by that time can pick up azithromycin. She is flying to [place] this weekend for daughters birthday so can start meds on Friday before party on Friday if not improving. Consider mask on plane  Treatment: -other symptomatic care with delysm for cough -Antibiotic indicated: yes potentially- will take on Friday if not continuing to improve or worsens  Hypertension S: home this AM in 110s A/P: advised avoid decongestants but controlled on amlodipine and diuretic   Finally, we reviewed reasons to return to care including if symptoms worsen or persist  or new concerns arise. Can use Urgent care in [place] if needed  Meds ordered this encounter  Medications  . azithromycin (ZITHROMAX) 250 MG tablet    Sig: Take 2 tabs on day 1, then 1 tab daily until finished    Dispense:  6 tablet    Refill:  0     Garret Reddish, MD

## 2016-05-02 NOTE — Patient Instructions (Signed)
Potential bacterial sinusitis based on: Symptoms of 10 days. Her sinus symptoms and congestion are not primary concern though- really the lingering cough. This may be more of a URI vs bronchitis but lungs sound great. We opted to give her through the end of week and if not better by that time can pick up azithromycin. She is flying to [place] this weekend for daughters birthday so can start meds on Friday before party on Friday if not improving. Consider mask on plane  Treatment: -other symptomatic care with delysm for cough -Antibiotic indicated: yes potentially- will take on Friday if not continuing to improve or worsens  Finally, we reviewed reasons to return to care including if symptoms worsen or persist or new concerns arise. Can use Urgent care in [place] if needed  Meds ordered this encounter  Medications  . azithromycin (ZITHROMAX) 250 MG tablet    Sig: Take 2 tabs on day 1, then 1 tab daily until finished    Dispense:  6 tablet    Refill:  0

## 2016-05-02 NOTE — Progress Notes (Signed)
Pre visit review using our clinic review tool, if applicable. No additional management support is needed unless otherwise documented below in the visit note. 

## 2016-06-08 ENCOUNTER — Ambulatory Visit (INDEPENDENT_AMBULATORY_CARE_PROVIDER_SITE_OTHER): Payer: Medicare Other

## 2016-06-08 DIAGNOSIS — Z23 Encounter for immunization: Secondary | ICD-10-CM

## 2016-06-30 ENCOUNTER — Other Ambulatory Visit: Payer: Self-pay | Admitting: Pharmacist

## 2016-06-30 NOTE — Patient Outreach (Signed)
Outreach call to [facility] regarding her request for follow up from the [facility] Medication Adherence Campaign. Left a HIPAA compliant message on the patient's voicemail.  Harlow Asa, PharmD, [place] [facility] [facility] [phone]

## 2016-07-04 ENCOUNTER — Other Ambulatory Visit: Payer: Self-pay | Admitting: Family Medicine

## 2016-10-04 ENCOUNTER — Ambulatory Visit: Payer: Medicare Other | Admitting: Family Medicine

## 2017-06-08 ENCOUNTER — Other Ambulatory Visit: Payer: Self-pay | Admitting: Internal Medicine

## 2017-06-08 DIAGNOSIS — Z1231 Encounter for screening mammogram for malignant neoplasm of breast: Secondary | ICD-10-CM

## 2017-07-10 ENCOUNTER — Ambulatory Visit
Admission: RE | Admit: 2017-07-10 | Discharge: 2017-07-10 | Disposition: A | Discharge status: Home / Self Care | Payer: Medicare Other | Source: Ambulatory Visit | Attending: Internal Medicine | Admitting: Internal Medicine

## 2017-07-10 DIAGNOSIS — Z1231 Encounter for screening mammogram for malignant neoplasm of breast: Secondary | ICD-10-CM

## 2017-07-11 ENCOUNTER — Other Ambulatory Visit: Payer: Self-pay | Admitting: Internal Medicine

## 2017-07-11 DIAGNOSIS — R928 Other abnormal and inconclusive findings on diagnostic imaging of breast: Secondary | ICD-10-CM

## 2017-07-23 ENCOUNTER — Other Ambulatory Visit: Payer: Self-pay | Admitting: Internal Medicine

## 2017-07-23 ENCOUNTER — Ambulatory Visit
Admission: RE | Admit: 2017-07-23 | Discharge: 2017-07-23 | Disposition: A | Discharge status: Home / Self Care | Payer: Medicare Other | Source: Ambulatory Visit | Attending: Internal Medicine | Admitting: Internal Medicine

## 2017-07-23 DIAGNOSIS — R928 Other abnormal and inconclusive findings on diagnostic imaging of breast: Secondary | ICD-10-CM

## 2017-07-23 DIAGNOSIS — N6310 Unspecified lump in the right breast, unspecified quadrant: Secondary | ICD-10-CM

## 2017-07-25 ENCOUNTER — Other Ambulatory Visit: Payer: Self-pay | Admitting: Internal Medicine

## 2017-07-25 ENCOUNTER — Ambulatory Visit
Admission: RE | Admit: 2017-07-25 | Discharge: 2017-07-25 | Disposition: A | Discharge status: Home / Self Care | Payer: Medicare Other | Source: Ambulatory Visit | Attending: Internal Medicine | Admitting: Internal Medicine

## 2017-07-25 DIAGNOSIS — N6310 Unspecified lump in the right breast, unspecified quadrant: Secondary | ICD-10-CM

## 2017-07-27 ENCOUNTER — Other Ambulatory Visit: Payer: Medicare Other

## 2018-03-14 ENCOUNTER — Other Ambulatory Visit: Payer: Self-pay | Admitting: General Surgery

## 2018-03-14 DIAGNOSIS — N6489 Other specified disorders of breast: Secondary | ICD-10-CM

## 2018-03-19 ENCOUNTER — Ambulatory Visit
Admission: RE | Admit: 2018-03-19 | Discharge: 2018-03-19 | Disposition: A | Discharge status: Home / Self Care | Payer: Medicare Other | Source: Ambulatory Visit | Attending: General Surgery | Admitting: General Surgery

## 2018-03-19 ENCOUNTER — Ambulatory Visit: Payer: Medicare Other

## 2018-03-19 DIAGNOSIS — N6489 Other specified disorders of breast: Secondary | ICD-10-CM

## 2018-09-16 ENCOUNTER — Other Ambulatory Visit: Payer: Self-pay | Admitting: Internal Medicine

## 2018-09-16 DIAGNOSIS — N6310 Unspecified lump in the right breast, unspecified quadrant: Secondary | ICD-10-CM

## 2018-09-23 ENCOUNTER — Other Ambulatory Visit: Payer: Self-pay | Admitting: Internal Medicine

## 2018-09-23 ENCOUNTER — Ambulatory Visit
Admission: RE | Admit: 2018-09-23 | Discharge: 2018-09-23 | Disposition: A | Discharge status: Home / Self Care | Payer: Medicare Other | Source: Ambulatory Visit | Attending: Internal Medicine | Admitting: Internal Medicine

## 2018-09-23 DIAGNOSIS — N6310 Unspecified lump in the right breast, unspecified quadrant: Secondary | ICD-10-CM

## 2019-08-12 ENCOUNTER — Ambulatory Visit: Payer: Medicare Other | Attending: Internal Medicine

## 2019-08-12 DIAGNOSIS — Z23 Encounter for immunization: Secondary | ICD-10-CM | POA: Insufficient documentation

## 2019-08-12 NOTE — Progress Notes (Signed)
   Covid-19 Vaccination Clinic  Name:  Theresa Chambers    MRN: [id number] DOB: Dec 29, 1932  08/12/2019  Theresa Chambers was observed post Covid-19 immunization for 15 minutes without incidence. She was provided with Vaccine Information Sheet and instruction to access the V-Safe system.   Theresa Chambers was instructed to call 911 with any severe reactions post vaccine: Marland Kitchen Difficulty breathing  . Swelling of your face and throat  . A fast heartbeat  . A bad rash all over your body  . Dizziness and weakness    Immunizations Administered    Name Date Dose VIS Date Route   Pfizer COVID-19 Vaccine 08/12/2019  8:36 AM 0.3 mL 07/04/2019 Intramuscular   Manufacturer: [place]   Lot: [place]: [id number]

## 2019-08-23 ENCOUNTER — Ambulatory Visit: Payer: Medicare Other

## 2019-09-02 ENCOUNTER — Ambulatory Visit: Payer: Medicare Other | Attending: Internal Medicine

## 2019-09-02 DIAGNOSIS — Z23 Encounter for immunization: Secondary | ICD-10-CM | POA: Insufficient documentation

## 2019-09-02 NOTE — Progress Notes (Signed)
   Covid-19 Vaccination Clinic  Name:  Theresa Chambers    MRN: [id number] DOB: 1933-06-24  09/02/2019  [name] was observed post Covid-19 immunization for 15 minutes without incidence. She was provided with Vaccine Information Sheet and instruction to access the V-Safe system.   [name] was instructed to call 911 with any severe reactions post vaccine: Marland Kitchen Difficulty breathing  . Swelling of your face and throat  . A fast heartbeat  . A bad rash all over your body  . Dizziness and weakness    Immunizations Administered    Name Date Dose VIS Date Route   Pfizer COVID-19 Vaccine 09/02/2019  8:34 AM 0.3 mL 07/04/2019 Intramuscular   Manufacturer: [place]   Lot: [id number]   [place]: [id number]

## 2019-10-04 ENCOUNTER — Ambulatory Visit (HOSPITAL_COMMUNITY)
Admission: EM | Admit: 2019-10-04 | Discharge: 2019-10-04 | Disposition: A | Discharge status: Home / Self Care | Payer: Medicare Other | Attending: Family Medicine | Admitting: Family Medicine

## 2019-10-04 ENCOUNTER — Encounter (HOSPITAL_COMMUNITY): Payer: Self-pay | Admitting: Emergency Medicine

## 2019-10-04 ENCOUNTER — Other Ambulatory Visit: Payer: Self-pay

## 2019-10-04 DIAGNOSIS — S81811A Laceration without foreign body, right lower leg, initial encounter: Secondary | ICD-10-CM | POA: Diagnosis not present

## 2019-10-04 NOTE — ED Triage Notes (Signed)
Today was picking up sticks and a stick jabbed right lower leg.  Wound was wrapped on arrival to treatment room, patient reports wound bled a lot at her home.

## 2019-10-04 NOTE — ED Provider Notes (Signed)
[place]    CSN: [id number] Arrival date & time: 10/04/19  1656      History   Chief Complaint Chief Complaint  Patient presents with  . Laceration    HPI Theresa Chambers is a 86 y.o. female.   This is an 86 year old woman making her initial visit to [facility] urgent care.  She has a puncture wound on the right leg.  She was in the woods and a stick punctured the anterior aspect of her right lower extremity.  There is quite a bit of bleeding at first but it stopped now.  Last tetanus shot was 2012.  Patient is in no acute distress.     Past Medical History:  Diagnosis Date  . History of lumpectomy    benign about 40 years ago in 2016  . Hyperlipidemia   . Hypertension   . Osteopenia     Patient Active Problem List   Diagnosis Date Noted  . Squamous cell carcinoma of skin 06/08/2015  . Hyperlipidemia 02/26/2007  . Essential hypertension 02/14/2007    Past Surgical History:  Procedure Laterality Date  . TONSILLECTOMY      OB History   No obstetric history on file.      Home Medications    Prior to Admission medications   Medication Sig Start Date End Date Taking? Authorizing Provider  amLODipine (NORVASC) 5 MG tablet take 1 tablet by mouth  At bed time 04/05/16  Yes Marin Olp, MD  aspirin 81 MG tablet Take 81 mg by mouth daily.     Yes [provider]  atorvastatin (LIPITOR) 20 MG tablet take 1 tablet by mouth EVERY MONDAY AND FRIDAY 04/05/16  Yes Marin Olp, MD  Calcium Carbonate (CALTRATE 600 PO) Take by mouth daily.     Yes [provider]  Multiple Vitamin (MULTIVITAMIN) tablet Take 1 tablet by mouth daily.     Yes [provider]  indapamide (LOZOL) 1.25 MG tablet TAKE ONE TABLET BY MOUTH EVERY MORNING 07/04/16   Marin Olp, MD  promethazine (PHENERGAN) 25 MG tablet Take 1 tablet (25 mg total) by mouth every 6 (six) hours as needed for nausea or vomiting. Patient not taking: Reported on  05/02/2016 04/05/16 10/04/19  Marin Olp, MD    Family History Family History  Problem Relation Age of Onset  . AAA (abdominal aortic aneurysm) Mother        smoker  . Hypertension Mother   . Breast cancer Neg Hx     Social History Social History   Tobacco Use  . Smoking status: Former Smoker    Packs/day: 0.10    Years: 20.00    Pack years: 2.00    Types: Cigarettes    Quit date: 07/24/1973    Years since quitting: 46.2  . Tobacco comment: quit in 1975  Substance Use Topics  . Alcohol use: Yes    Alcohol/week: 3.0 standard drinks    Types: 3 Standard drinks or equivalent per week  . Drug use: No     Allergies   Codeine and Penicillins   Review of Systems Review of Systems  Skin: Positive for wound.  All other systems reviewed and are negative.    Physical Exam Triage Vital Signs ED Triage Vitals  Enc Vitals Group     BP      Pulse      Resp      Temp      Temp src  SpO2      Weight      Height      Head Circumference      Peak Flow      Pain Score      Pain Loc      Pain Edu?      Excl. in [place]?    No data found.  Updated Vital Signs BP (!) 186/83 (BP Location: Left Arm)   Pulse 90   Temp 98.2 F (36.8 C) (Oral)   Resp 20   SpO2 98%    Physical Exam Vitals and nursing note reviewed.  Constitutional:      General: She is not in acute distress.    Appearance: Normal appearance. She is normal weight.  Eyes:     Conjunctiva/sclera: Conjunctivae normal.  Pulmonary:     Effort: Pulmonary effort is normal.  Musculoskeletal:        General: Signs of injury present. Normal range of motion.     Cervical back: Normal range of motion and neck supple.  Skin:    General: Skin is warm.     Comments: There is an 8 mm open wound in the anterior aspect of the lower right extremity is approximately 5 mm deep.  The wound was cleansed with alcohol and then bacitracin dressing applied with a Steri-Strip closure and sterile dressing.    Neurological:     General: No focal deficit present.     Mental Status: She is alert.  Psychiatric:        Mood and Affect: Mood normal.      UC Treatments / Results  Labs (all labs ordered are listed, but only abnormal results are displayed) Labs Reviewed - No data to display  EKG   Radiology No results found.  Procedures Procedures (including critical care time)  Medications Ordered in UC Medications - No data to display  Initial Impression / Assessment and Plan / UC Course  I have reviewed the triage vital signs and the nursing notes.  Pertinent labs & imaging results that were available during my care of the patient were reviewed by me and considered in my medical decision making (see chart for details).    Final Clinical Impressions(s) / UC Diagnoses   Final diagnoses:  Leg laceration, right, initial encounter     Discharge Instructions     Patient to return if any sign of infection    ED Prescriptions    None     I have reviewed the PDMP during this encounter.   Robyn Haber, MD 10/04/19 1821

## 2019-10-04 NOTE — Discharge Instructions (Addendum)
Patient to return if any sign of infection

## 2019-10-05 ENCOUNTER — Encounter (HOSPITAL_COMMUNITY): Payer: Self-pay

## 2019-10-05 ENCOUNTER — Other Ambulatory Visit: Payer: Self-pay

## 2019-10-05 ENCOUNTER — Ambulatory Visit (HOSPITAL_COMMUNITY)
Admission: EM | Admit: 2019-10-05 | Discharge: 2019-10-05 | Disposition: A | Discharge status: Home / Self Care | Payer: Medicare Other | Attending: Family Medicine | Admitting: Family Medicine

## 2019-10-05 DIAGNOSIS — Z5189 Encounter for other specified aftercare: Secondary | ICD-10-CM

## 2019-10-05 NOTE — ED Provider Notes (Signed)
[place]    CSN: [id number] Arrival date & time: 10/05/19  1018      History   Chief Complaint Chief Complaint  Patient presents with  . Wound Check    HPI Theresa Chambers is a 86 y.o. female.   This 86 year old woman was seen yesterday for puncture wound in the anterior aspect of her lower right extremity.  She had been punctured by a stick in the woods when she was doing yard work.  The wound was cleansed and then dressed with bacitracin, closed with a derma cleft and then covered with gauze and Coban.     Past Medical History:  Diagnosis Date  . History of lumpectomy    benign about 40 years ago in 2016  . Hyperlipidemia   . Hypertension   . Osteopenia     Patient Active Problem List   Diagnosis Date Noted  . Squamous cell carcinoma of skin 06/08/2015  . Hyperlipidemia 02/26/2007  . Essential hypertension 02/14/2007    Past Surgical History:  Procedure Laterality Date  . TONSILLECTOMY      OB History   No obstetric history on file.      Home Medications    Prior to Admission medications   Medication Sig Start Date End Date Taking? Authorizing Provider  ALPRAZolam Duanne Moron) 0.5 MG tablet Take 0.5 mg by mouth 2 (two) times daily as needed. 09/04/19   [provider]  amLODipine (NORVASC) 5 MG tablet take 1 tablet by mouth  At bed time 04/05/16   Marin Olp, MD  aspirin 81 MG tablet Take 81 mg by mouth daily.      [provider]  atorvastatin (LIPITOR) 10 MG tablet Take 10 mg by mouth daily. 09/22/19   [provider]  atorvastatin (LIPITOR) 20 MG tablet take 1 tablet by mouth EVERY MONDAY AND FRIDAY 04/05/16   Marin Olp, MD  Calcium Carbonate (CALTRATE 600 PO) Take by mouth daily.      [provider]  indapamide (LOZOL) 1.25 MG tablet TAKE ONE TABLET BY MOUTH EVERY MORNING 07/04/16   Marin Olp, MD  latanoprost (XALATAN) 0.005 % ophthalmic solution  08/21/19   [provider]    Multiple Vitamin (MULTIVITAMIN) tablet Take 1 tablet by mouth daily.      [provider]  promethazine (PHENERGAN) 25 MG tablet Take 1 tablet (25 mg total) by mouth every 6 (six) hours as needed for nausea or vomiting. Patient not taking: Reported on 05/02/2016 04/05/16 10/04/19  Marin Olp, MD    Family History Family History  Problem Relation Age of Onset  . AAA (abdominal aortic aneurysm) Mother        smoker  . Hypertension Mother   . Breast cancer Neg Hx     Social History Social History   Tobacco Use  . Smoking status: Former Smoker    Packs/day: 0.10    Years: 20.00    Pack years: 2.00    Types: Cigarettes    Quit date: 07/24/1973    Years since quitting: 46.2  . Smokeless tobacco: Never Used  . Tobacco comment: quit in 1975  Substance Use Topics  . Alcohol use: Yes    Alcohol/week: 3.0 standard drinks    Types: 3 Standard drinks or equivalent per week  . Drug use: No     Allergies   Codeine and Penicillins   Review of Systems Review of Systems   Physical Exam Triage Vital Signs ED  Triage Vitals  Enc Vitals Group     BP      Pulse      Resp      Temp      Temp src      SpO2      Weight      Height      Head Circumference      Peak Flow      Pain Score      Pain Loc      Pain Edu?      Excl. in [place]?    No data found.  Updated Vital Signs BP (!) 162/75 (BP Location: Right Arm)   Pulse 82   Temp 98.3 F (36.8 C) (Oral)   Resp 17   Wt 61.2 kg   SpO2 97%   BMI 25.09 kg/m    Physical Exam Vitals and nursing note reviewed.  Constitutional:      General: She is not in acute distress.    Appearance: Normal appearance. She is normal weight. She is not ill-appearing.  Pulmonary:     Effort: Pulmonary effort is normal.  Musculoskeletal:        General: Signs of injury present.     Cervical back: Normal range of motion and neck supple.  Skin:    General: Skin is warm.     Findings: Lesion present.     Comments: The  Dermaclip had fallen apart, so, using benzoin, the new  Dermaclip was applied and the wound bandaged.  Neurological:     Mental Status: She is alert.  Psychiatric:        Mood and Affect: Mood normal.      UC Treatments / Results  Labs (all labs ordered are listed, but only abnormal results are displayed) Labs Reviewed - No data to display  EKG   Radiology No results found.  Procedures Procedures (including critical care time)  Medications Ordered in UC Medications - No data to display  Initial Impression / Assessment and Plan / UC Course  I have reviewed the triage vital signs and the nursing notes.  Pertinent labs & imaging results that were available during my care of the patient were reviewed by me and considered in my medical decision making (see chart for details).    Final Clinical Impressions(s) / UC Diagnoses   Final diagnoses:  Visit for wound check   Discharge Instructions   None    ED Prescriptions    None     I have reviewed the PDMP during this encounter.   Robyn Haber, MD 10/05/19 1133

## 2019-10-05 NOTE — ED Triage Notes (Signed)
Pt is here for a follow up on a wound on her right leg that she was seen here for on 3/13.

## 2020-09-03 ENCOUNTER — Other Ambulatory Visit: Payer: Self-pay | Admitting: Internal Medicine

## 2020-09-03 DIAGNOSIS — Z1231 Encounter for screening mammogram for malignant neoplasm of breast: Secondary | ICD-10-CM

## 2020-10-11 ENCOUNTER — Other Ambulatory Visit: Payer: Self-pay | Admitting: Internal Medicine

## 2020-10-11 DIAGNOSIS — N6310 Unspecified lump in the right breast, unspecified quadrant: Secondary | ICD-10-CM

## 2020-10-14 IMAGING — MG DIGITAL DIAGNOSTIC BILATERAL MAMMOGRAM WITH TOMO AND CAD
8 series · 8 of 24 positions shown · non-contrast
Comparison: Previous exam(s).

CLINICAL DATA: 85-year-old female with history of a complex
sclerosing lesion biopsied from the right breast. Patient prefers
short-term interval follow ups instead of surgical excision.

EXAM:
DIGITAL DIAGNOSTIC BILATERAL MAMMOGRAM WITH CAD AND TOMO

[R MLO synth-2D]
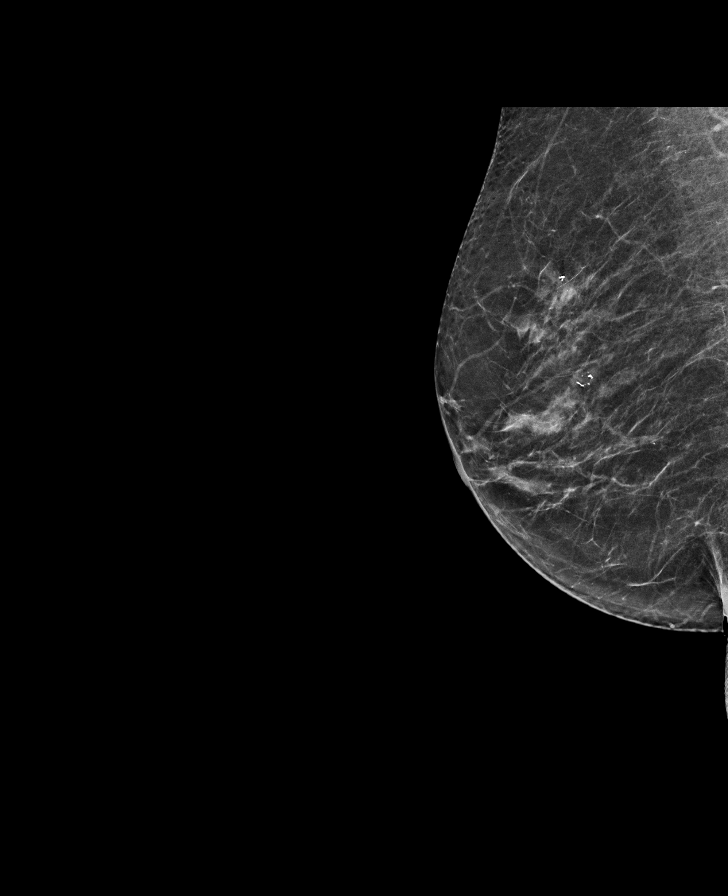

[L MLO synth-2D]
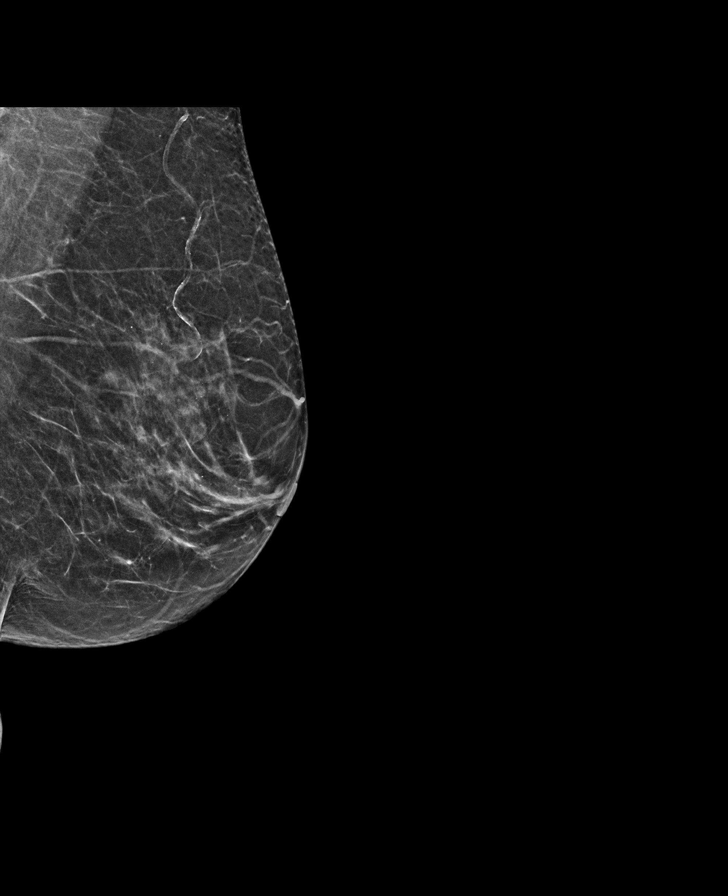

[R CC synth-2D]
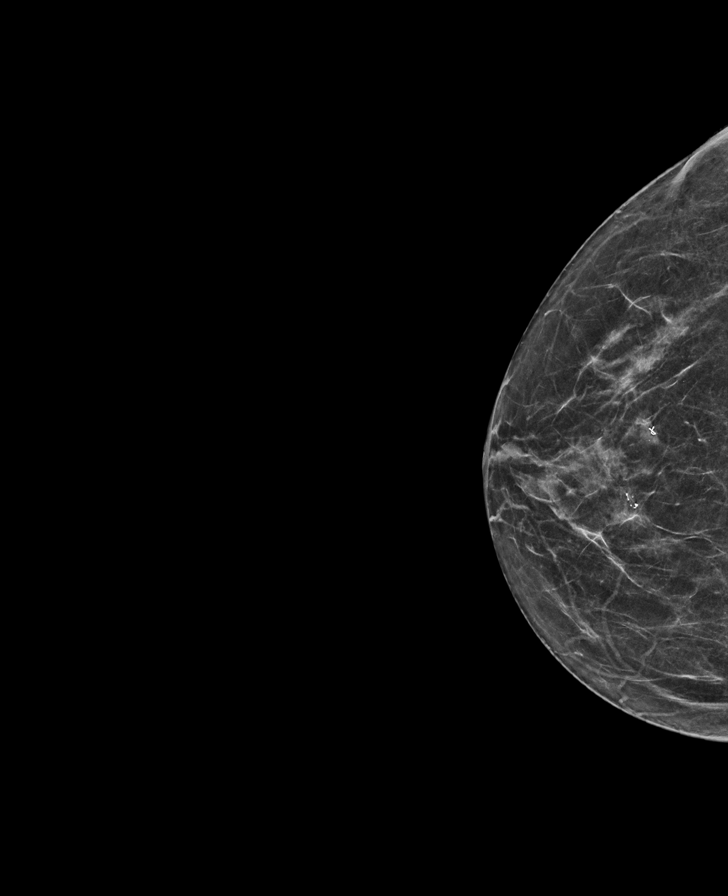

[L CC synth-2D]
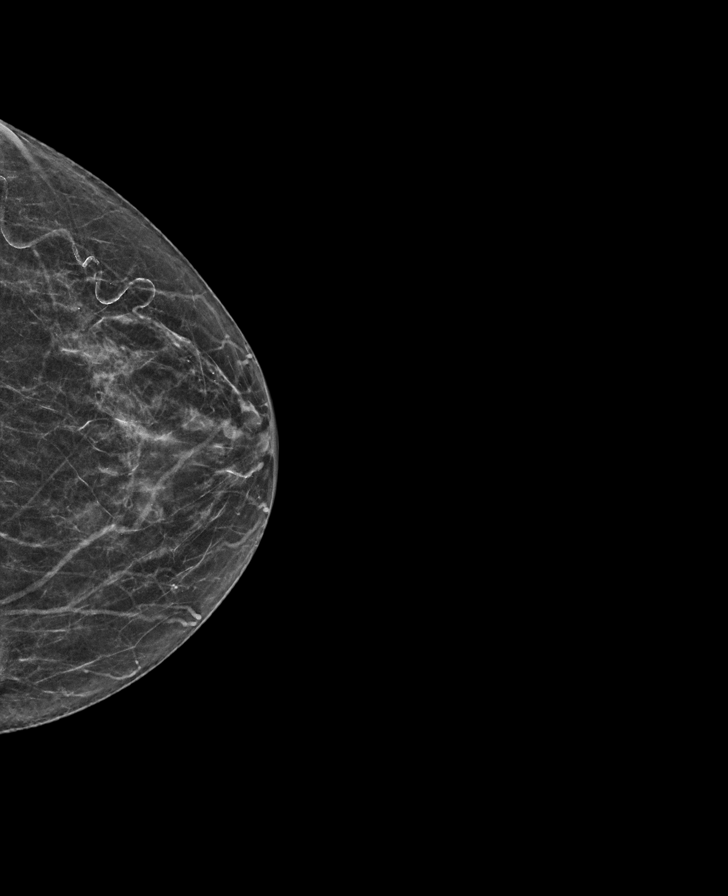

[R MLO tomo · tomo slice 27/54.0]
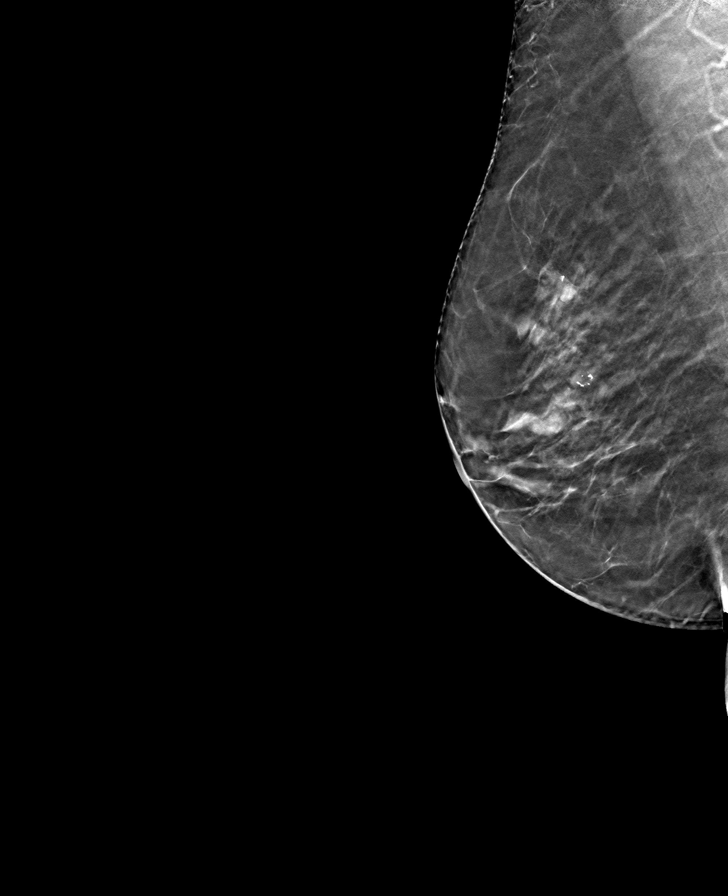

[L MLO tomo · tomo slice 27/53.0]
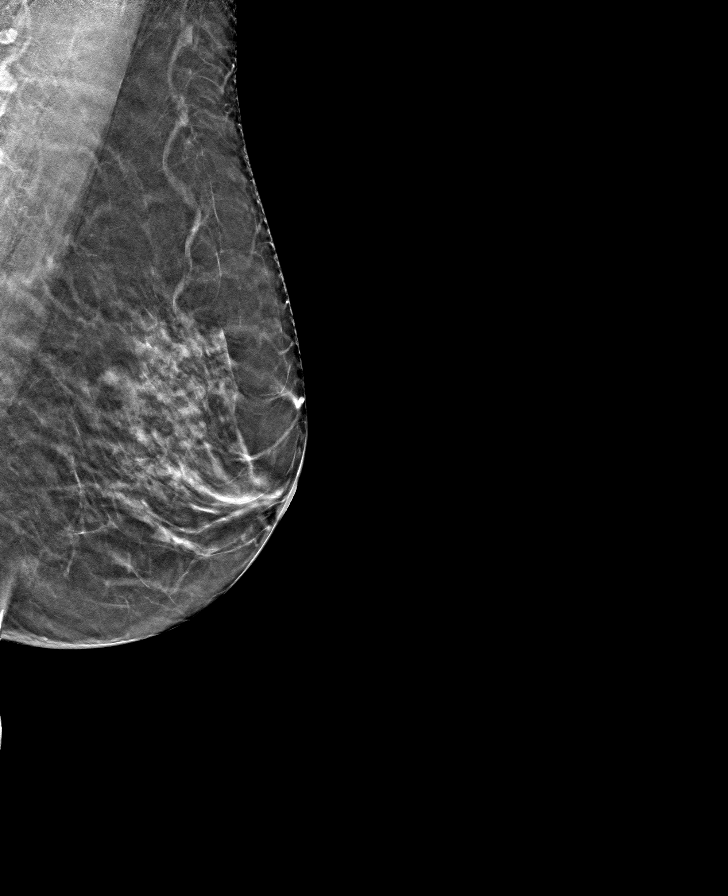

[R CC tomo · tomo slice 29/56.0]
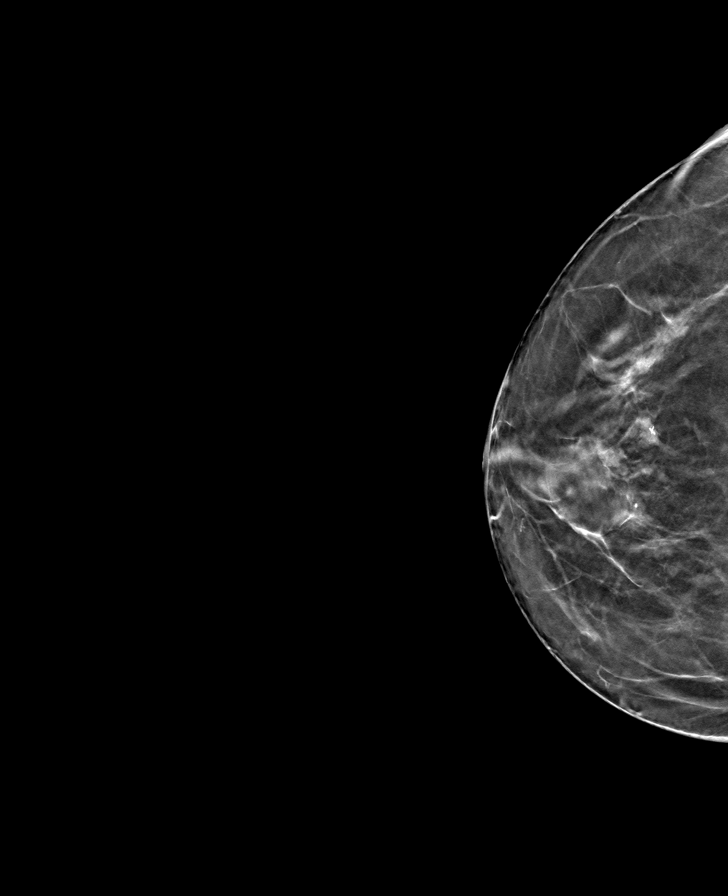

[L CC tomo · tomo slice 27/53.0]
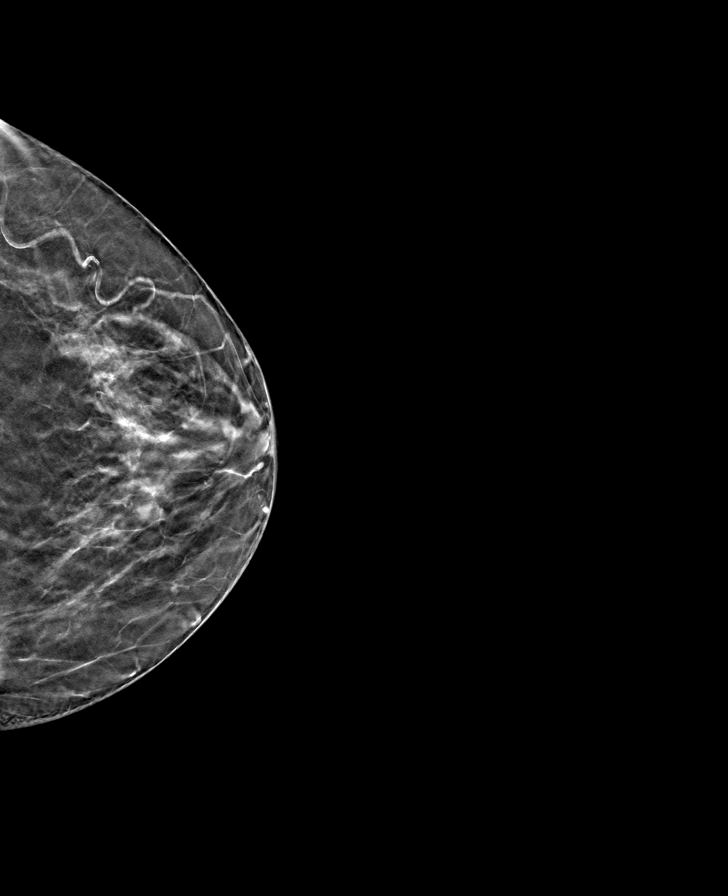

[8 of 24 positions shown; findings below may reference images not displayed]

ACR Breast Density Category b: There are scattered areas of
fibroglandular density.
FINDINGS: There is a ribbon shaped clip in the upper-outer quadrant of the
right breast associated with a oval mass unchanged in appearance
from the prior exam dated 07/23/2017. No suspicious mass or
malignant type microcalcifications identified in either breast.

Mammographic images were processed with CAD.
IMPRESSION: Stable mass in the right breast associated with a ribbon shaped clip
marking the site of the biopsy complex sclerosing lesion.

RECOMMENDATION:
Diagnostic right mammogram in 6 months is recommended.

I have discussed the findings and recommendations with the patient.
Results were also provided in writing at the conclusion of the
visit. If applicable, a reminder letter will be sent to the patient
regarding the next appointment.

BI-RADS CATEGORY  3: Probably benign.

## 2021-06-22 NOTE — Progress Notes (Signed)
[place] Phone: [phone] Subjective:   Fontaine No, am serving as a scribe for Dr. Hulan Saas.  This visit occurred during the SARS-CoV-2 public health emergency.  Safety protocols were in place, including screening questions prior to the visit, additional usage of staff PPE, and extensive cleaning of exam room while observing appropriate contact time as indicated for disinfecting solutions.   I'm seeing this patient by the request  of:  Crist Infante, MD  CC: left shoulder and arm pain   [id number]  Theresa Chambers is a 87 y.o. female coming in with complaint of L shoulder and arm pain. Patient states that she has 2 vaccines: one in 2019 and one in 2020 which caused her arm pain. Pain is constant and located in L bicep. Denies any radiating symptoms.       Past Medical History:  Diagnosis Date   History of lumpectomy    benign about 40 years ago in 2016   Hyperlipidemia    Hypertension    Osteopenia    Past Surgical History:  Procedure Laterality Date   TONSILLECTOMY     Social History   Socioeconomic History   Marital status: Married    Spouse name: Not on file   Number of children: Not on file   Years of education: Not on file   Highest education level: Not on file  Occupational History   Not on file  Tobacco Use   Smoking status: Former    Packs/day: 0.10    Years: 20.00    Pack years: 2.00    Types: Cigarettes    Quit date: 07/24/1973    Years since quitting: 47.9   Smokeless tobacco: Never   Tobacco comments:    quit in 1975  Substance and Sexual Activity   Alcohol use: Yes    Alcohol/week: 3.0 standard drinks    Types: 3 Standard drinks or equivalent per week   Drug use: No   Sexual activity: Not Currently    Birth control/protection: None  Other Topics Concern   Not on file  Social History Narrative   Widowed 2008 due to alzheimers. Lives alone. 2 children. 3  grandchildren (all went to [facility])   Completely independent. 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       Work 3 days a week at [employer].       Hobbies: goes out 3 days a week, goes to [employer], plays cards, goes to movies, walks, church      Social Determinants of Health   Financial Resource Strain: Not on file  Food Insecurity: Not on file  Transportation Needs: Not on file  Physical Activity: Not on file  Stress: Not on file  Social Connections: Not on file   Allergies  Allergen Reactions   Codeine    Penicillins    Family History  Problem Relation Age of Onset   AAA (abdominal aortic aneurysm) Mother        smoker   Hypertension Mother    Breast cancer Neg Hx      Current Outpatient Medications (Cardiovascular):    amLODipine (NORVASC) 5 MG tablet, take 1 tablet by mouth  At bed time   atorvastatin (LIPITOR) 10 MG tablet, Take 10 mg by mouth daily.   atorvastatin (LIPITOR) 20 MG tablet, take 1 tablet by mouth EVERY MONDAY AND FRIDAY   indapamide (LOZOL) 1.25 MG tablet, TAKE ONE TABLET BY MOUTH  EVERY MORNING   Current Outpatient Medications (Analgesics):    aspirin 81 MG tablet, Take 81 mg by mouth daily.     Current Outpatient Medications (Other):    ALPRAZolam (XANAX) 0.5 MG tablet, Take 0.5 mg by mouth 2 (two) times daily as needed.   Calcium Carbonate (CALTRATE 600 PO), Take by mouth daily.     latanoprost (XALATAN) 0.005 % ophthalmic solution,    Multiple Vitamin (MULTIVITAMIN) tablet, Take 1 tablet by mouth daily.     Reviewed prior external information including notes and imaging from  primary care provider As well as notes that were available from care everywhere and other healthcare systems.  Past medical history, social, surgical and family history all reviewed in electronic medical record.  No pertanent information unless stated regarding to the chief complaint.   Review of Systems:  No headache, visual changes, nausea, vomiting, diarrhea,  constipation, dizziness, abdominal pain, skin rash, fevers, chills, night sweats, weight loss, swollen lymph nodes, body aches, joint swelling, chest pain, shortness of breath, mood changes. POSITIVE muscle aches  Objective  Blood pressure (!) 158/90, pulse 88, height 5' 1.5" (1.562 m), weight 137 lb (62.1 kg), SpO2 98 %.   General: No apparent distress alert and oriented x3 mood and affect normal, dressed appropriately.  HEENT: Pupils equal, extraocular movements intact  Respiratory: Patient's speak in full sentences and does not appear short of breath  Cardiovascular: No lower extremity edema, non tender, no erythema  Gait mild antalgic MSK: Left shoulder does have some atrophy of the musculature noted.  No significant crepitus with range of motion.  Patient has 3-5 strength of the rotator cuff.  Patient is unable to AB duct arm greater than 80 degrees.   Limited muscular skeletal ultrasound was performed and interpreted by Hulan Saas, M  Limited ultrasound of patient's left shoulder shows that patient does have significant effusion noted of the anterior aspect of the shoulder.  Seems to go intra-articular.  Patient does have severe narrowing of the acromioclavicular joint.  Does appear to have a very large tear of the supraspinatus with significant atrophy of the musculature.  Patient does have near bone-on-bone osteoarthritic changes of the glenohumeral joint on the posterior aspect.  Impression: Rotator cuff arthropathy     Impression and Recommendations:     The above documentation has been reviewed and is accurate and complete Lyndal Pulley, DO

## 2021-06-23 ENCOUNTER — Ambulatory Visit (INDEPENDENT_AMBULATORY_CARE_PROVIDER_SITE_OTHER): Payer: Medicare Other

## 2021-06-23 ENCOUNTER — Ambulatory Visit: Payer: Medicare Other | Admitting: Family Medicine

## 2021-06-23 ENCOUNTER — Other Ambulatory Visit: Payer: Self-pay

## 2021-06-23 ENCOUNTER — Ambulatory Visit: Payer: Self-pay

## 2021-06-23 ENCOUNTER — Encounter: Payer: Self-pay | Admitting: Family Medicine

## 2021-06-23 VITALS — BP 158/90 | HR 88 | Ht 61.5 in | Wt 137.0 lb

## 2021-06-23 DIAGNOSIS — M25512 Pain in left shoulder: Secondary | ICD-10-CM

## 2021-06-23 DIAGNOSIS — M12812 Other specific arthropathies, not elsewhere classified, left shoulder: Secondary | ICD-10-CM

## 2021-06-23 DIAGNOSIS — M12811 Other specific arthropathies, not elsewhere classified, right shoulder: Secondary | ICD-10-CM | POA: Insufficient documentation

## 2021-06-23 NOTE — Patient Instructions (Addendum)
Xray today Good to see you.  Ice 20 minutes 2 times daily. Usually after activity and before bed. Exercises 3 times a week.  Turmeric 500mg  daily  Tart cherry extract 1200mg  at night Vitamin D 2000 IU daily  See me again in 6 weeks if in pain will consider injection

## 2021-06-23 NOTE — Assessment & Plan Note (Signed)
Patient has significant arthritic changes of the shoulder noted.  Patient likely has had this for years and just has had an exacerbation.  On ultrasound there is unfortunately effusion of the anterior aspect of the shoulder.  We discussed the potential of aspiration and then potential injection.  Patient declined and wanted to do more conservative therapy.  Patient getting home exercises and work with Product/process development scientist.  Do not feel that physical therapy would help significantly and encouraged her not to try to increase range of motion too much.  Patient wants to consider more of the body supplementation using Tylenol first.  Follow-up with me again 6 weeks and if worsening pain consider formal physical therapy and injection

## 2021-07-05 ENCOUNTER — Encounter: Payer: Self-pay | Admitting: Family Medicine

## 2021-07-05 ENCOUNTER — Other Ambulatory Visit: Payer: Self-pay

## 2021-07-05 ENCOUNTER — Ambulatory Visit: Payer: Self-pay

## 2021-07-05 ENCOUNTER — Ambulatory Visit (INDEPENDENT_AMBULATORY_CARE_PROVIDER_SITE_OTHER): Payer: Medicare Other | Admitting: Family Medicine

## 2021-07-05 VITALS — BP 148/92 | HR 83 | Ht 61.5 in | Wt 137.0 lb

## 2021-07-05 DIAGNOSIS — M25512 Pain in left shoulder: Secondary | ICD-10-CM

## 2021-07-05 DIAGNOSIS — M12812 Other specific arthropathies, not elsewhere classified, left shoulder: Secondary | ICD-10-CM

## 2021-07-05 MED ORDER — DOXYCYCLINE HYCLATE 100 MG PO TABS: 100.0000 mg | ORAL_TABLET | Freq: Two times a day (BID) | ORAL | 0 refills | Status: AC

## 2021-07-05 NOTE — Patient Instructions (Addendum)
Injected shoulder Antibiotic called in if you need it If any increased redness or pain, start immediately Keep monitoring If worsening pain will consider surgery See me in 6-8weeks

## 2021-07-05 NOTE — Assessment & Plan Note (Signed)
Patient did have aspiration done today but did have a significant amount of blood in the area.  Discussed with patient about the potential for advanced imaging.  Once again secondary to the severity of the arthritic changes do not know if you change anything significantly with patient wanting to avoid any type of surgical intervention.  Discussed on that area may help [place] if there is any occult fracture or anything else that could be contributing.  Patient wanted to get injection today to help while she goes visits her great grandchildren and hopefully she will feel better.  Discussed continuing the same other exercises, icing regimen and vitamin supplementations.  Follow-up again in 6 to 8 weeks

## 2021-07-05 NOTE — Progress Notes (Signed)
[place] Phone: [phone] Subjective:   Fontaine No, am serving as a scribe for Dr. Hulan Saas.  This visit occurred during the SARS-CoV-2 public health emergency.  Safety protocols were in place, including screening questions prior to the visit, additional usage of staff PPE, and extensive cleaning of exam room while observing appropriate contact time as indicated for disinfecting solutions.   I'm seeing this patient by the request  of:  Crist Infante, MD  CC: Left shoulder pain follow-up  [id number]  06/23/2021 Patient has significant arthritic changes of the shoulder noted.  Patient likely has had this for years and just has had an exacerbation.  On ultrasound there is unfortunately effusion of the anterior aspect of the shoulder.  We discussed the potential of aspiration and then potential injection.  Patient declined and wanted to do more conservative therapy.  Patient getting home exercises and work with Product/process development scientist.  Do not feel that physical therapy would help significantly and encouraged her not to try to increase range of motion too much.  Patient wants to consider more of the body supplementation using Tylenol first.  Follow-up with me again 6 weeks and if worsening pain consider formal physical therapy and injection  Update 07/06/2021 QUANTA ROBERTSHAW is a 87 y.o. female coming in with complaint of L shoulder pain. Patient states that her pain is getting worse in L shoulder. Would like injection today.   06/23/2021 L shoulder xray   Significant arthritic changes noted of the shoulder with bone-on-bone glenohumeral joint.  This was independently visualized by me today.       Past Medical History:  Diagnosis Date   History of lumpectomy    benign about 40 years ago in 2016   Hyperlipidemia    Hypertension    Osteopenia    Past Surgical History:  Procedure Laterality Date    TONSILLECTOMY     Social History   Socioeconomic History   Marital status: Married    Spouse name: Not on file   Number of children: Not on file   Years of education: Not on file   Highest education level: Not on file  Occupational History   Not on file  Tobacco Use   Smoking status: Former    Packs/day: 0.10    Years: 20.00    Pack years: 2.00    Types: Cigarettes    Quit date: 07/24/1973    Years since quitting: 47.9   Smokeless tobacco: Never   Tobacco comments:    quit in 1975  Substance and Sexual Activity   Alcohol use: Yes    Alcohol/week: 3.0 standard drinks    Types: 3 Standard drinks or equivalent per week   Drug use: No   Sexual activity: Not Currently    Birth control/protection: None  Other Topics Concern   Not on file  Social History Narrative   Widowed 2008 due to alzheimers. Lives alone. 2 children. 3 grandchildren (all went to [facility])   Completely independent. 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       Work 3 days a week at [employer].       Hobbies: goes out 3 days a week, goes to [employer], plays cards, goes to movies, walks, church      Social Determinants of Health   Financial Resource Strain: Not on file  Food Insecurity: Not on file  Transportation Needs: Not on file  Physical  Activity: Not on file  Stress: Not on file  Social Connections: Not on file   Allergies  Allergen Reactions   Codeine    Penicillins    Family History  Problem Relation Age of Onset   AAA (abdominal aortic aneurysm) Mother        smoker   Hypertension Mother    Breast cancer Neg Hx      Current Outpatient Medications (Cardiovascular):    amLODipine (NORVASC) 5 MG tablet, take 1 tablet by mouth  At bed time   indapamide (LOZOL) 1.25 MG tablet, TAKE ONE TABLET BY MOUTH EVERY MORNING     Current Outpatient Medications (Other):    doxycycline (VIBRA-TABS) 100 MG tablet, Take 1 tablet (100 mg total) by mouth 2 (two) times daily.   latanoprost (XALATAN)  0.005 % ophthalmic solution,    Multiple Vitamin (MULTIVITAMIN) tablet, Take 1 tablet by mouth daily.     ALPRAZolam (XANAX) 0.5 MG tablet, Take 0.5 mg by mouth 2 (two) times daily as needed. (Patient not taking: Reported on 07/05/2021)   Reviewed prior external information including notes and imaging from  primary care provider As well as notes that were available from care everywhere and other healthcare systems.  Past medical history, social, surgical and family history all reviewed in electronic medical record.  No pertanent information unless stated regarding to the chief complaint.   Review of Systems:  No headache, visual changes, nausea, vomiting, diarrhea, constipation, dizziness, abdominal pain, skin rash, fevers, chills, night sweats, weight loss, swollen lymph nodes, body aches, joint swelling, chest pain, shortness of breath, mood changes. POSITIVE muscle aches  Objective  Blood pressure (!) 148/92, pulse 83, height 5' 1.5" (1.562 m), weight 137 lb (62.1 kg), SpO2 98 %.   General: No apparent distress alert and oriented x3 mood and affect normal, dressed appropriately.  HEENT: Pupils equal, extraocular movements intact  Respiratory: Patient's speak in full sentences and does not appear short of breath  Cardiovascular: No lower extremity edema, non tender, no erythema  Gait mild antalgic Left shoulder exam does have some pain over the anterior and lateral aspect of the shoulder.  Limited range of motion with 2+ out of 5 strength of the rotator cuff.  Procedure: Real-time Ultrasound Guided Injection of left glenohumeral joint Device: GE Logiq E  Ultrasound guided injection is preferred based studies that show increased duration, increased effect, greater accuracy, decreased procedural pain, increased response rate with ultrasound guided versus blind injection.  Verbal informed consent obtained.  Time-out conducted.  Noted no overlying erythema, induration, or other signs of  local infection.  Skin prepped in a sterile fashion.  Local anesthesia: Topical Ethyl chloride.  With sterile technique and under real time ultrasound guidance:  Joint visualized.  21g 2 inch needle inserted anterior approach. Pictures taken for needle placement. Patient did have injection of 2 cc of 0.5% Marcaine, then aspirated 16 cc of blood-tinged fluid and 1cc of Kenalog 40 mg/dL. Completed without difficulty  Pain immediately improved suggesting accurate placement of the medication.  Advised to call if fevers/chills, erythema, induration, drainage, or persistent bleeding.  Prescription for antibiotic given today secondary to patient traveling. Images permanently stored and available for review in the ultrasound unit.  Impression: Technically successful ultrasound guided injection.    Impression and Recommendations:     The above documentation has been reviewed and is accurate and complete Lyndal Pulley, DO

## 2021-08-04 ENCOUNTER — Ambulatory Visit: Payer: Medicare Other | Admitting: Family Medicine

## 2021-08-22 NOTE — [facility] Notes (Signed)
[facility] [place] [address] [place] Phone: [phone] Subjective:   [name], am serving as a scribe for Dr. Hulan Saas. This visit occurred during the SARS-CoV-2 public health emergency.  Safety protocols were in place, including screening questions prior to the visit, additional usage of staff PPE, and extensive cleaning of exam room while observing appropriate contact time as indicated for disinfecting solutions.   I'm seeing this patient by the request  of:  Crist Infante, MD  CC: Left shoulder pain follow-up  [id number]  07/05/2021 Patient did have aspiration done today but did have a significant amount of blood in the area.  Discussed with patient about the potential for advanced imaging.  Once again secondary to the severity of the arthritic changes do not know if you change anything significantly with patient wanting to avoid any type of surgical intervention.  Discussed on that area may help [place] if there is any occult fracture or anything else that could be contributing.  Patient wanted to get injection today to help while she goes visits her great grandchildren and hopefully she will feel better.  Discussed continuing the same other exercises, icing regimen and vitamin supplementations.  Follow-up again in 6 to 8 weeks  Update 08/23/2021 AGNESS SIBRIAN is a 88 y.o. female coming in with complaint of L shoulder pain. Patient states pain has gotten worse since last visit, but she knows how to ease it better. Takes an Advil every morning, she ices, and uses Voltaren and it helps a lot.      Past Medical History:  Diagnosis Date   History of lumpectomy    benign about 40 years ago in 2016   Hyperlipidemia    Hypertension    Osteopenia    Past Surgical History:  Procedure Laterality Date   TONSILLECTOMY     Social History   Socioeconomic History   Marital status: Married    Spouse name: Not on file   Number of  children: Not on file   Years of education: Not on file   Highest education level: Not on file  Occupational History   Not on file  Tobacco Use   Smoking status: Former    Packs/day: 0.10    Years: 20.00    Pack years: 2.00    Types: Cigarettes    Quit date: 07/24/1973    Years since quitting: 48.1   Smokeless tobacco: Never   Tobacco comments:    quit in 1975  Substance and Sexual Activity   Alcohol use: Yes    Alcohol/week: 3.0 standard drinks    Types: 3 Standard drinks or equivalent per week   Drug use: No   Sexual activity: Not Currently    Birth control/protection: None  Other Topics Concern   Not on file  Social History Narrative   Widowed 2008 due to alzheimers. Lives alone. 2 children. 3 grandchildren (all went to [facility])   Completely independent. 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       Work 3 days a week at [employer].       Hobbies: goes out 3 days a week, goes to [employer], plays cards, goes to movies, walks, church      Social Determinants of Health   Financial Resource Strain: Not on file  Food Insecurity: Not on file  Transportation Needs: Not on file  Physical Activity: Not on file  Stress: Not on file  Social Connections: Not on  file   Allergies  Allergen Reactions   Codeine    Penicillins    Family History  Problem Relation Age of Onset   AAA (abdominal aortic aneurysm) Mother        smoker   Hypertension Mother    Breast cancer Neg Hx      Current Outpatient Medications (Cardiovascular):    amLODipine (NORVASC) 5 MG tablet, take 1 tablet by mouth  At bed time   indapamide (LOZOL) 1.25 MG tablet, TAKE ONE TABLET BY MOUTH EVERY MORNING     Current Outpatient Medications (Other):    ALPRAZolam (XANAX) 0.5 MG tablet, Take 0.5 mg by mouth 2 (two) times daily as needed. (Patient not taking: Reported on 07/05/2021)   doxycycline (VIBRA-TABS) 100 MG tablet, Take 1 tablet (100 mg total) by mouth 2 (two) times daily.   latanoprost (XALATAN)  0.005 % ophthalmic solution,    Multiple Vitamin (MULTIVITAMIN) tablet, Take 1 tablet by mouth daily.       Objective  Blood pressure (!) 168/90, pulse 69, height 5\' 1"  (1.549 m), weight 134 lb (60.8 kg), SpO2 99 %.   General: No apparent distress alert and oriented x3 mood and affect normal, dressed appropriately.  HEENT: Pupils equal, extraocular movements intact  Respiratory: Patient's speak in full sentences and does not appear short of breath  Cardiovascular: No lower extremity edema, non tender, no erythema  Gait antalgic Left shoulder exam does show that patient does have significant arthritic changes noted.  Does have less effusion noted than previous exam no.  Patient does have still crepitus noted in all range of motion.  Limited muscular skeletal ultrasound was performed and interpreted by Hulan Saas, M  Limited ultrasound still shows the patient has a significant arthritic changes noted of the shoulder.  Patient does have some mild hypoechoic changes on the anterior and posterior aspect of the glenohumeral joint consistent with small effusion still noted.  It is improvement from previous exam.   Impression and Recommendations:     The above documentation has been reviewed and is accurate and complete Lyndal Pulley, DO

## 2021-08-23 ENCOUNTER — Ambulatory Visit: Payer: Self-pay

## 2021-08-23 ENCOUNTER — Ambulatory Visit (INDEPENDENT_AMBULATORY_CARE_PROVIDER_SITE_OTHER): Payer: Medicare Other | Admitting: Family Medicine

## 2021-08-23 ENCOUNTER — Other Ambulatory Visit: Payer: Self-pay

## 2021-08-23 ENCOUNTER — Encounter: Payer: Self-pay | Admitting: Family Medicine

## 2021-08-23 VITALS — BP 168/90 | HR 69 | Ht 61.0 in | Wt 134.0 lb

## 2021-08-23 DIAGNOSIS — M12812 Other specific arthropathies, not elsewhere classified, left shoulder: Secondary | ICD-10-CM

## 2021-08-23 NOTE — Assessment & Plan Note (Signed)
Severe bone-on-bone osteoarthritic changes.  Significant improvement in the effusion that was noted previously.  Discussed with patient about different treatment options again including advanced imaging with either a CT scan or MRI.  Patient states that she does not think she would want any surgical intervention still.  We will continue to monitor.  Worsening pain or swelling she is to come see [place] again.  Patient will follow-up with me again in 2 months otherwise

## 2021-08-23 NOTE — Patient Instructions (Signed)
Remind me not to play bridge with you Call with any worsening pain in shoulder See you again in 2 months

## 2021-09-12 ENCOUNTER — Telehealth: Payer: Self-pay | Admitting: Family Medicine

## 2021-09-12 NOTE — Telephone Encounter (Signed)
Patient called stating that she finally found the [facility] that Dr Tamala Julian recommended. She is using Nature's Truth Ultra Tart Cherry Extract 1200mg  and taking it before bedtime. Since using it, she had felt so much better and has has had hardly any pain at night. She asked if she could take it in the morning as well?  Please advise.

## 2021-09-13 NOTE — Telephone Encounter (Signed)
Spoke with patient per recommendation.

## 2021-10-17 NOTE — Progress Notes (Signed)
?[name] D.O. ?[facility] ?[place] ?Phone: [phone] ?Subjective:   ?I, [name], am serving as a scribe for Dr. Hulan Saas. ? ?This visit occurred during the SARS-CoV-2 public health emergency.  Safety protocols were in place, including screening questions prior to the visit, additional usage of staff PPE, and extensive cleaning of exam room while observing appropriate contact time as indicated for disinfecting solutions.  ? ? ?I'm seeing this patient by the request  of:  Crist Infante, MD ? ?CC: Left shoulder pain follow-up ? ?[id number]  ?08/23/2021 ?Severe bone-on-bone osteoarthritic changes.  Significant improvement in the effusion that was noted previously.  Discussed with patient about different treatment options again including advanced imaging with either a CT scan or MRI.  Patient states that she does not think she would want any surgical intervention still.  We will continue to monitor.  Worsening pain or swelling she is to come see [place] again.  Patient will follow-up with me again in 2 months otherwise ? ?Update 10/18/2021 ?Theresa Chambers is a 88 y.o. female coming in with complaint of L shoulder pain. Patient states that she had 3 days without pain. Went out of town and had to use railing which caused increase in her pain with climbing stairs. Tender when she sleeps on L side. Pain is in upper arm muscles.  ? ?  ? ?Past Medical History:  ?Diagnosis Date  ? History of lumpectomy   ? benign about 40 years ago in 2016  ? Hyperlipidemia   ? Hypertension   ? Osteopenia   ? ?Past Surgical History:  ?Procedure Laterality Date  ? TONSILLECTOMY    ? ?Social History  ? ?Socioeconomic History  ? Marital status: Married  ?  Spouse name: Not on file  ? Number of children: Not on file  ? Years of education: Not on file  ? Highest education level: Not on file  ?Occupational History  ? Not on file  ?Tobacco Use  ? Smoking status: Former  ?  Packs/day: 0.10  ?   Years: 20.00  ?  Pack years: 2.00  ?  Types: Cigarettes  ?  Quit date: 07/24/1973  ?  Years since quitting: 48.2  ? Smokeless tobacco: Never  ? Tobacco comments:  ?  quit in 1975  ?Substance and Sexual Activity  ? Alcohol use: Yes  ?  Alcohol/week: 3.0 standard drinks  ?  Types: 3 Standard drinks or equivalent per week  ? Drug use: No  ? Sexual activity: Not Currently  ?  Birth control/protection: None  ?Other Topics Concern  ? Not on file  ?Social History Narrative  ? Widowed 2008 due to alzheimers. Lives alone. 2 children. 3 grandchildren (all went to [facility])  ? Completely independent. 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   ?   ? 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   ?   ? Hobbies: goes out 3 days a week, goes to [employer], plays cards, goes to movies, walks, church  ?   ? ?Social Determinants of Health  ? ?Financial Resource Strain: Not on file  ?Food Insecurity: Not on file  ?Transportation Needs: Not on file  ?Physical Activity: Not on file  ?Stress: Not on file  ?Social Connections: Not on file  ? ?Allergies  ?Allergen Reactions  ? Codeine   ? Penicillins   ? ?Family History  ?Problem Relation Age of Onset  ? AAA (abdominal aortic aneurysm) Mother   ?  smoker  ? Hypertension Mother   ? Breast cancer Neg Hx   ? ? ? ?Current Outpatient Medications (Cardiovascular):  ?  amLODipine (NORVASC) 5 MG tablet, take 1 tablet by mouth  At bed time ?  indapamide (LOZOL) 1.25 MG tablet, TAKE ONE TABLET BY MOUTH EVERY MORNING ? ? ? ? ?Current Outpatient Medications (Other):  ?  ALPRAZolam (XANAX) 0.5 MG tablet, Take 0.5 mg by mouth 2 (two) times daily as needed. ?  doxycycline (VIBRA-TABS) 100 MG tablet, Take 1 tablet (100 mg total) by mouth 2 (two) times daily. ?  latanoprost (XALATAN) 0.005 % ophthalmic solution,  ?  Multiple Vitamin (MULTIVITAMIN) tablet, Take 1 tablet by mouth daily.   ? ? ?Reviewed prior external information including notes and imaging from  ?primary care provider ?As well as notes that were available  from care everywhere and other healthcare systems. ? ?Past medical history, social, surgical and family history all reviewed in electronic medical record.  No pertanent information unless stated regarding to the chief complaint.  ? ?Review of Systems: ? No headache, visual changes, nausea, vomiting, diarrhea, constipation, dizziness, abdominal pain, skin rash, fevers, chills, night sweats, weight loss, swollen lymph nodes, body aches, joint swelling, chest pain, shortness of breath, mood changes. POSITIVE muscle aches ? ?Objective  ?Blood pressure 128/88, pulse 74, height '5\' 1"'$  (1.549 m), weight 137 lb (62.1 kg), SpO2 98 %. ?  ?General: No apparent distress alert and oriented x3 mood and affect normal, dressed appropriately.  ?HEENT: Pupils equal, extraocular movements intact  ?Respiratory: Patient's speak in full sentences and does not appear short of breath  ?Cardiovascular: No lower extremity edema, non tender, no erythema  ?Gait normal with good balance and coordination.  ?MSK: Arthritic changes in multiple areas.  Patient does have atrophy of the left shoulder girdle noted.  Patient does have tenderness to palpation over the paraspinal musculature as well. ?Procedure: Real-time Ultrasound Guided Injection of left glenohumeral joint ?Device: GE Logiq E  ?Ultrasound guided injection is preferred based studies that show increased duration, increased effect, greater accuracy, decreased procedural pain, increased response rate with ultrasound guided versus blind injection.  ?Verbal informed consent obtained.  ?Time-out conducted.  ?Noted no overlying erythema, induration, or other signs of local infection.  ?Skin prepped in a sterile fashion.  ?Local anesthesia: Topical Ethyl chloride.  ?With sterile technique and under real time ultrasound guidance:  Joint visualized.  21g 2 inch needle inserted posterior approach. Pictures taken for needle placement. Patient did have injection of 2 cc of 0.5% Marcaine, and 1cc of  Kenalog 40 mg/dL. ?Completed without difficulty  ?Pain immediately resolved suggesting accurate placement of the medication.  ?Advised to call if fevers/chills, erythema, induration, drainage, or persistent bleeding.  ?Impression: Technically successful ultrasound guided injection. ?  ?Impression and Recommendations:  ?  ? ?The above documentation has been reviewed and is accurate and complete Lyndal Pulley, DO ? ? ? ?

## 2021-10-18 ENCOUNTER — Ambulatory Visit: Payer: Self-pay

## 2021-10-18 ENCOUNTER — Ambulatory Visit (INDEPENDENT_AMBULATORY_CARE_PROVIDER_SITE_OTHER): Payer: Medicare Other | Admitting: Family Medicine

## 2021-10-18 ENCOUNTER — Other Ambulatory Visit: Payer: Self-pay

## 2021-10-18 ENCOUNTER — Encounter: Payer: Self-pay | Admitting: Family Medicine

## 2021-10-18 VITALS — BP 128/88 | HR 74 | Ht 61.0 in | Wt 137.0 lb

## 2021-10-18 DIAGNOSIS — G8929 Other chronic pain: Secondary | ICD-10-CM

## 2021-10-18 DIAGNOSIS — M12812 Other specific arthropathies, not elsewhere classified, left shoulder: Secondary | ICD-10-CM

## 2021-10-18 DIAGNOSIS — M25512 Pain in left shoulder: Secondary | ICD-10-CM

## 2021-10-18 NOTE — Assessment & Plan Note (Signed)
Discussed with patient continuing this every 3 months if necessary.  If worsening pain can consider the possibility of surgical intervention as well.  Discussed icing regimen and home exercise, discussed which activities to do which ones to avoid.  Increase activity slowly.  Follow-up with me again in 3 months ?

## 2021-10-18 NOTE — Patient Instructions (Addendum)
Injection today ?You know the drill ?See you again in 10-12 weeks ? ?

## 2021-12-27 ENCOUNTER — Ambulatory Visit: Payer: Medicare Other | Admitting: Family Medicine

## 2022-01-10 NOTE — [facility] Notes (Unsigned)
[place] Phone: [phone] Subjective:    I'm seeing this patient by the request  of:  Crist Infante, MD  CC:   [id number]  10/18/2021 Discussed with patient continuing this every 3 months if necessary.  If worsening pain can consider the possibility of surgical intervention as well.  Discussed icing regimen and home exercise, discussed which activities to do which ones to avoid.  Increase activity slowly.  Follow-up with me again in 3 months  Update 01/11/2022 Theresa Chambers is a 88 y.o. female coming in with complaint of L shoulder pain. Patient states        Past Medical History:  Diagnosis Date   History of lumpectomy    benign about 40 years ago in 2016   Hyperlipidemia    Hypertension    Osteopenia    Past Surgical History:  Procedure Laterality Date   TONSILLECTOMY     Social History   Socioeconomic History   Marital status: Married    Spouse name: Not on file   Number of children: Not on file   Years of education: Not on file   Highest education level: Not on file  Occupational History   Not on file  Tobacco Use   Smoking status: Former    Packs/day: 0.10    Years: 20.00    Total pack years: 2.00    Types: Cigarettes    Quit date: 07/24/1973    Years since quitting: 48.4   Smokeless tobacco: Never   Tobacco comments:    quit in 1975  Substance and Sexual Activity   Alcohol use: Yes    Alcohol/week: 3.0 standard drinks of alcohol    Types: 3 Standard drinks or equivalent per week   Drug use: No   Sexual activity: Not Currently    Birth control/protection: None  Other Topics Concern   Not on file  Social History Narrative   Widowed 2008 due to alzheimers. Lives alone. 2 children. 3 grandchildren (all went to [facility])   Completely independent. 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       Work 3 days a week at [employer].       Hobbies: goes out 3 days a week, goes to [employer], plays cards, goes to movies, walks, church      Social Determinants of Health   Financial Resource Strain: Not on file  Food Insecurity: Not on file  Transportation Needs: Not on file  Physical Activity: Not on file  Stress: Not on file  Social Connections: Not on file   Allergies  Allergen Reactions   Codeine    Penicillins    Family History  Problem Relation Age of Onset   AAA (abdominal aortic aneurysm) Mother        smoker   Hypertension Mother    Breast cancer Neg Hx      Current Outpatient Medications (Cardiovascular):    amLODipine (NORVASC) 5 MG tablet, take 1 tablet by mouth  At bed time   indapamide (LOZOL) 1.25 MG tablet, TAKE ONE TABLET BY MOUTH EVERY MORNING     Current Outpatient Medications (Other):    ALPRAZolam (XANAX) 0.5 MG tablet, Take 0.5 mg by mouth 2 (two) times daily as needed.   doxycycline (VIBRA-TABS) 100 MG tablet, Take 1 tablet (100 mg total) by mouth 2 (two) times daily.   latanoprost (XALATAN) 0.005 % ophthalmic solution,    Multiple Vitamin (MULTIVITAMIN) tablet,  Take 1 tablet by mouth daily.     Reviewed prior external information including notes and imaging from  primary care provider As well as notes that were available from care everywhere and other healthcare systems.  Past medical history, social, surgical and family history all reviewed in electronic medical record.  No pertanent information unless stated regarding to the chief complaint.   Review of Systems:  No headache, visual changes, nausea, vomiting, diarrhea, constipation, dizziness, abdominal pain, skin rash, fevers, chills, night sweats, weight loss, swollen lymph nodes, body aches, joint swelling, chest pain, shortness of breath, mood changes. POSITIVE muscle aches  Objective  There were no vitals taken for this visit.   General: No apparent distress alert and oriented x3 mood and affect normal, dressed appropriately.  HEENT: Pupils equal, extraocular  movements intact  Respiratory: Patient's speak in full sentences and does not appear short of breath  Cardiovascular: No lower extremity edema, non tender, no erythema      Impression and Recommendations:

## 2022-01-11 ENCOUNTER — Ambulatory Visit: Payer: Medicare Other | Admitting: Family Medicine

## 2022-01-11 ENCOUNTER — Ambulatory Visit: Payer: Self-pay

## 2022-01-11 ENCOUNTER — Encounter: Payer: Self-pay | Admitting: Family Medicine

## 2022-01-11 VITALS — BP 132/84 | HR 70 | Ht 61.0 in | Wt 136.0 lb

## 2022-01-11 DIAGNOSIS — M25512 Pain in left shoulder: Secondary | ICD-10-CM | POA: Diagnosis not present

## 2022-01-11 DIAGNOSIS — G8929 Other chronic pain: Secondary | ICD-10-CM

## 2022-01-11 DIAGNOSIS — M12812 Other specific arthropathies, not elsewhere classified, left shoulder: Secondary | ICD-10-CM

## 2022-01-11 NOTE — Assessment & Plan Note (Signed)
Patient is doing so well at this moment.  We will hold on any type of injection at the moment.  He does have the severe arthritic changes.  Patient wants to avoid any type of surgical intervention secondary to patient's age and comorbidities.  Follow-up with me again in 3 months otherwise.

## 2022-01-11 NOTE — Patient Instructions (Signed)
You are dong so great! Keep it up! See me again in 3 months!

## 2022-05-15 NOTE — Progress Notes (Signed)
[facility] [place] [address] [place] [facility] Phone: [phone] Subjective:   The[facility], am serving as a scribe for Dr. Hulan Chambers.  I'm seeing this patient by the request  of:  Theresa Infante, MD  CC: Left shoulder pain follow-up  [id number]  01/11/2022 Patient is doing so well at this moment.  We will hold on any type of injection at the moment.  He does have the severe arthritic changes.  Patient wants to avoid any type of surgical intervention secondary to patient's age and comorbidities.  Follow-up with me again in 3 months otherwise.  Update 05/16/2022 [facility] is a 88 y.o. female coming in with complaint of L shoulder pain. Patient states injection from June lasted a good amount of time. Shoulder started hurting about a month ago. Now right shoulder has some discomfort from possible overuse patient states that the left shoulder though is the one that is waking her up at night and affecting daily activities including combing her hair and dressing.      Past Medical History:  Diagnosis Date   History of lumpectomy    benign about 40 years ago in 2016   Hyperlipidemia    Hypertension    Osteopenia    Past Surgical History:  Procedure Laterality Date   TONSILLECTOMY     Social History   Socioeconomic History   Marital status: Married    Spouse name: Not on file   Number of children: Not on file   Years of education: Not on file   Highest education level: Not on file  Occupational History   Not on file  Tobacco Use   Smoking status: Former    Packs/day: 0.10    Years: 20.00    Total pack years: 2.00    Types: Cigarettes    Quit date: 07/24/1973    Years since quitting: 48.8   Smokeless tobacco: Never   Tobacco comments:    quit in 1975  Substance and Sexual Activity   Alcohol use: Yes    Alcohol/week: 3.0 standard drinks of alcohol    Types: 3 Standard drinks or equivalent per week   Drug use: No    Sexual activity: Not Currently    Birth control/protection: None  Other Topics Concern   Not on file  Social History Narrative   Widowed 2008 due to alzheimers. Lives alone. 2 children. 3 grandchildren (all went to [facility])   Completely independent. 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       Work 3 days a week at [facility].       Hobbies: goes out 3 days a week, goes to [employer], plays cards, goes to movies, walks, church      Social Determinants of Health   Financial Resource Strain: Not on file  Food Insecurity: Not on file  Transportation Needs: Not on file  Physical Activity: Not on file  Stress: Not on file  Social Connections: Not on file   Allergies  Allergen Reactions   Codeine    Penicillins    Family History  Problem Relation Age of Onset   AAA (abdominal aortic aneurysm) Mother        smoker   Hypertension Mother    Breast cancer Neg Hx      Current Outpatient Medications (Cardiovascular):    amLODipine (NORVASC) 5 MG tablet, take 1 tablet by mouth  At bed time   indapamide (LOZOL) 1.25 MG tablet, TAKE  ONE TABLET BY MOUTH EVERY MORNING     Current Outpatient Medications (Other):    ALPRAZolam (XANAX) 0.5 MG tablet, Take 0.5 mg by mouth 2 (two) times daily as needed.   doxycycline (VIBRA-TABS) 100 MG tablet, Take 1 tablet (100 mg total) by mouth 2 (two) times daily.   latanoprost (XALATAN) 0.005 % ophthalmic solution,    Multiple Vitamin (MULTIVITAMIN) tablet, Take 1 tablet by mouth daily.     Reviewed prior external information including notes and imaging from  primary care provider As well as notes that were available from care everywhere and other healthcare systems.  Past medical history, social, surgical and family history all reviewed in electronic medical record.  No pertanent information unless stated regarding to the chief complaint.   Review of Systems:  No headache, visual changes, nausea, vomiting, diarrhea, constipation, dizziness,  abdominal pain, skin rash, fevers, chills, night sweats, weight loss, swollen lymph nodes, body aches, joint swelling, chest pain, shortness of breath, mood changes. POSITIVE muscle aches  Objective  Blood pressure 128/84, pulse 73, height '5\' 1"'$  (1.549 m), weight 135 lb (61.2 kg), SpO2 96 %.   General: No apparent distress alert and oriented x3 mood and affect normal, dressed appropriately.  HEENT: Pupils equal, extraocular movements intact  Respiratory: Patient's speak in full sentences and does not appear short of breath  Cardiovascular: No lower extremity edema, non tender, no erythema  Patient's does have arthritic changes of multiple joints.  Atrophy noted of the left shoulder.  Patient does have crepitus noted with any type of passive or active range of motion.  Weakness of the rotator cuff noted.  Procedure: Real-time Ultrasound Guided Injection of left glenohumeral joint Device: GE Logiq E  Ultrasound guided injection is preferred based studies that show increased duration, increased effect, greater accuracy, decreased procedural pain, increased response rate with ultrasound guided versus blind injection.  Verbal informed consent obtained.  Time-out conducted.  Noted no overlying erythema, induration, or other signs of local infection.  Skin prepped in a sterile fashion.  Local anesthesia: Topical Ethyl chloride.  With sterile technique and under real time ultrasound guidance:  Joint visualized.  21g 2 inch needle inserted posterior approach. Pictures taken for needle placement. Patient did have injection of 2 cc of 0.5% Marcaine, and 1cc of Kenalog 40 mg/dL. Completed without difficulty  Pain immediately resolved suggesting accurate placement of the medication.  Advised to call if fevers/chills, erythema, induration, drainage, or persistent bleeding.  Impression: Technically successful ultrasound guided injection.    Impression and Recommendations:      The above documentation  has been reviewed and is accurate and complete Lyndal Pulley, DO

## 2022-05-16 ENCOUNTER — Ambulatory Visit: Payer: Medicare Other | Admitting: Family Medicine

## 2022-05-16 ENCOUNTER — Ambulatory Visit: Payer: Self-pay

## 2022-05-16 VITALS — BP 128/84 | HR 73 | Ht 61.0 in | Wt 135.0 lb

## 2022-05-16 DIAGNOSIS — M12812 Other specific arthropathies, not elsewhere classified, left shoulder: Secondary | ICD-10-CM

## 2022-05-16 NOTE — Assessment & Plan Note (Signed)
Chronic problem with worsening symptoms.  Does have severe arthritic changes of the shoulder.  Noted a small effusion again noted today.  Patient responded well to the injection and had some improvement in pain and range of motion but still has significant crepitus.  Due to patient's age and comorbidities patient wants to avoid any type of surgical intervention whatsoever at this time.  We will continue to further evaluate in 2 to 55-monthinterval.  The patient continues to have any worsening symptoms or difficulty with activities of daily living we do need to consider more aggressive therapies.  Follow-up again in 2 to 3 months

## 2022-05-16 NOTE — Patient Instructions (Signed)
Injection in left shoulder today Good to see you!

## 2022-08-10 NOTE — Progress Notes (Unsigned)
[facility] [place] Phone: [phone] Subjective:   Theresa [facility], am serving as a scribe for Dr. Hulan Saas.  I'm seeing this patient by the request  of:  Theresa Infante, MD  CC: Left shoulder pain follow-up  [id number]  05/16/2022 Chronic problem with worsening symptoms.  Does have severe arthritic changes of the shoulder.  Noted a small effusion again noted today.  Patient responded well to the injection and had some improvement in pain and range of motion but still has significant crepitus.  Due to patient's age and comorbidities patient wants to avoid any type of surgical intervention whatsoever at this time.  We will continue to further evaluate in 2 to 66-monthinterval.  The patient continues to have any worsening symptoms or difficulty with activities of daily living we do need to consider more aggressive therapies.  Follow-up again in 2 to 3 months   Update 08/16/2022 Theresa [facility] a 89y.o. female coming in with complaint of L shoulder pain. Patient states that she is the same as last visit. Certain positions cause sharp pain. Feels like she is managing her pain by not reaching over head.      Past Medical History:  Diagnosis Date   History of lumpectomy    benign about 40 years ago in 2016   Hyperlipidemia    Hypertension    Osteopenia    Past Surgical History:  Procedure Laterality Date   TONSILLECTOMY     Social History   Socioeconomic History   Marital status: Married    Spouse name: Not on file   Number of children: Not on file   Years of education: Not on file   Highest education level: Not on file  Occupational History   Not on file  Tobacco Use   Smoking status: Former    Packs/day: 0.10    Years: 20.00    Total pack years: 2.00    Types: Cigarettes    Quit date: 07/24/1973    Years since quitting: 49.0   Smokeless tobacco: Never   Tobacco comments:    quit in 1975   Substance and Sexual Activity   Alcohol use: Yes    Alcohol/week: 3.0 standard drinks of alcohol    Types: 3 Standard drinks or equivalent per week   Drug use: [facility]   Sexual activity: Not Currently    Birth control/protection: None  Other Topics Concern   Not on file  Social History Narrative   Widowed 2008 due to alzheimers. Lives alone. 2 children. 3 grandchildren ([facility]   Completely independent. 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       Work 3 days a week at [employer]       Hobbies: goes out 3 days a week, goes to [employer] plays cards, goes to movies, walks, church      Social Determinants of Health   Financial Resource Strain: Not on file  Food Insecurity: Not on file  Transportation Needs: Not on file  Physical Activity: Not on file  Stress: Not on file  Social Connections: Not on file   Allergies  Allergen Reactions   Codeine    Penicillins    Family History  Problem Relation Age of Onset   AAA (abdominal aortic aneurysm) Mother        smoker   Hypertension Mother    Breast cancer Neg Hx  Current Outpatient Medications (Cardiovascular):    amLODipine (NORVASC) 5 MG tablet, take 1 tablet by mouth  At bed time   indapamide (LOZOL) 1.25 MG tablet, TAKE ONE TABLET BY MOUTH EVERY MORNING     Current Outpatient Medications (Other):    ALPRAZolam (XANAX) 0.5 MG tablet, Take 0.5 mg by mouth 2 (two) times daily as needed.   doxycycline (VIBRA-TABS) 100 MG tablet, Take 1 tablet (100 mg total) by mouth 2 (two) times daily.   latanoprost (XALATAN) 0.005 % ophthalmic solution,    Multiple Vitamin (MULTIVITAMIN) tablet, Take 1 tablet by mouth daily.     Reviewed prior external information including notes and imaging from  primary care provider As well as notes that were available from care everywhere and other healthcare systems.  Past medical history, social, surgical and family history all reviewed in electronic medical record.  [facility] pertanent  information unless stated regarding to the chief complaint.   Review of Systems:  The[facility] headache, visual changes, nausea, vomiting, diarrhea, constipation, dizziness, abdominal pain, skin rash, fevers, chills, night sweats, weight loss, swollen lymph nodes,  joint swelling, chest pain, shortness of breath, mood changes. POSITIVE muscle aches, body aches  Objective  Blood pressure 118/84, pulse (!) 52, height '5\' 1"'$  (1.549 m), weight 134 lb (60.8 kg), SpO2 98 %.   General: [facility] apparent distress alert and oriented x3 mood and affect normal, dressed appropriately.  HEENT: Pupils equal, extraocular movements intact  Respiratory: Patient's speak in full sentences and does not appear short of breath  Cardiovascular: [facility] lower extremity edema, non tender, [facility] erythema  Left shoulder exam does have crepitus noted.  Significant atrophy noted.  Limited range of motion.   Procedure: Real-time Ultrasound Guided Injection of left glenohumeral joint Device: GE Logiq E  Ultrasound guided injection is preferred based studies that show increased duration, increased effect, greater accuracy, decreased procedural pain, increased response rate with ultrasound guided versus blind injection.  Verbal informed consent obtained.  Time-out conducted.  Noted [facility] overlying erythema, induration, or other signs of local infection.  Skin prepped in a sterile fashion.  Local anesthesia: Topical Ethyl chloride.  With sterile technique and under real time ultrasound guidance:  Joint visualized.  21g 2 inch needle inserted posterior approach. Pictures taken for needle placement. Patient did have injection of 2 cc of 0.5% Marcaine, and 1cc of Kenalog 40 mg/dL. Completed without difficulty  Pain immediately resolved suggesting accurate placement of the medication.  Advised to call if fevers/chills, erythema, induration, drainage, or persistent bleeding.  Impression: Technically successful ultrasound guided injection.   Impression  and Recommendations:      The above documentation has been reviewed and is accurate and complete Lyndal Pulley, DO

## 2022-08-16 ENCOUNTER — Ambulatory Visit: Payer: Medicare Other | Admitting: Family Medicine

## 2022-08-16 ENCOUNTER — Ambulatory Visit: Payer: Self-pay

## 2022-08-16 VITALS — BP 118/84 | HR 52 | Ht 61.0 in | Wt 134.0 lb

## 2022-08-16 DIAGNOSIS — M25512 Pain in left shoulder: Secondary | ICD-10-CM | POA: Diagnosis not present

## 2022-08-16 DIAGNOSIS — M12812 Other specific arthropathies, not elsewhere classified, left shoulder: Secondary | ICD-10-CM | POA: Diagnosis not present

## 2022-08-16 NOTE — Assessment & Plan Note (Signed)
Chronic problem with worsening symptoms.  Discussed with patient about icing regimen and home exercises.  Discussed that surgical intervention would be the more alternative treatment.  Feels like she does get approximately 6 hours of improvement and then seems to go down from there.  Discussed which activities to do and which ones to avoid.  Could consider possibly doing the injections more frequently and trying Toradol but otherwise if doing well can keep repeating every 3 months if needed.  Would like patient to follow-up with me note for further evaluation in 2 to 3 months

## 2022-08-16 NOTE — Patient Instructions (Addendum)
Injected shoulder today See me in 6 weeks if you need me otherwise move to 10 weeks

## 2022-10-03 ENCOUNTER — Ambulatory Visit: Payer: Medicare Other | Admitting: Family Medicine

## 2022-11-13 NOTE — Progress Notes (Addendum)
[name] [address] [place] [address] Phone: [phone] Subjective:   INadine Counts, am serving as a scribe for Dr. Antoine Primas.  I'm seeing this patient by the request  of:  Rodrigo Ran, MD  CC: left shoulder pain   [id number]  08/16/2022 Chronic problem with worsening symptoms. Discussed with patient about icing regimen and home exercises. Discussed that surgical intervention would be the more alternative treatment. Feels like she does get approximately 6 hours of improvement and then seems to go down from there. Discussed which activities to do and which ones to avoid. Could consider possibly doing the injections more frequently and trying Toradol but otherwise if doing well can keep repeating every 3 months if needed. Would like patient to follow-up with me note for further evaluation in 2 to 3 months   Update 11/14/2022 AYAHNA SOLAZZO is a 89 y.o. female coming in with complaint of L shoulder pain. Patient states that her pain is unchanged. Unable to perform full flexion.  Starting to wake her up at night.      Past Medical History:  Diagnosis Date   History of lumpectomy    benign about 40 years ago in 2016   Hyperlipidemia    Hypertension    Osteopenia    Past Surgical History:  Procedure Laterality Date   TONSILLECTOMY     Social History   Socioeconomic History   Marital status: Married    Spouse name: Not on file   Number of children: Not on file   Years of education: Not on file   Highest education level: Not on file  Occupational History   Not on file  Tobacco Use   Smoking status: Former    Packs/day: 0.10    Years: 20.00    Additional pack years: 0.00    Total pack years: 2.00    Types: Cigarettes    Quit date: 07/24/1973    Years since quitting: 49.3   Smokeless tobacco: Never   Tobacco comments:    quit in 1975  Substance and Sexual Activity   Alcohol use: Yes    Alcohol/week: 3.0 standard  drinks of alcohol    Types: 3 Standard drinks or equivalent per week   Drug use: No   Sexual activity: Not Currently    Birth control/protection: None  Other Topics Concern   Not on file  Social History Narrative   Widowed 2008 due to alzheimers. Lives alone. 2 children. 3 grandchildren (all went to [facility])   Completely independent. 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       Work 3 days a week at [employer].       Hobbies: goes out 3 days a week, goes to [employer], plays cards, goes to movies, walks, church      Social Determinants of Health   Financial Resource Strain: Not on file  Food Insecurity: Not on file  Transportation Needs: Not on file  Physical Activity: Not on file  Stress: Not on file  Social Connections: Not on file   Allergies  Allergen Reactions   Codeine    Penicillins    Family History  Problem Relation Age of Onset   AAA (abdominal aortic aneurysm) Mother        smoker   Hypertension Mother    Breast cancer Neg Hx      Current Outpatient Medications (Cardiovascular):    amLODipine (NORVASC) 5 MG tablet, take 1 tablet by  mouth  At bed time   indapamide (LOZOL) 1.25 MG tablet, TAKE ONE TABLET BY MOUTH EVERY MORNING     Current Outpatient Medications (Other):    ALPRAZolam (XANAX) 0.5 MG tablet, Take 0.5 mg by mouth 2 (two) times daily as needed.   doxycycline (VIBRA-TABS) 100 MG tablet, Take 1 tablet (100 mg total) by mouth 2 (two) times daily.   latanoprost (XALATAN) 0.005 % ophthalmic solution,    Multiple Vitamin (MULTIVITAMIN) tablet, Take 1 tablet by mouth daily.     Reviewed prior external information including notes and imaging from  primary care provider As well as notes that were available from care everywhere and other healthcare systems.  Past medical history, social, surgical and family history all reviewed in electronic medical record.  No pertanent information unless stated regarding to the chief complaint.   Review of  Systems:  No headache, visual changes, nausea, vomiting, diarrhea, constipation, dizziness, abdominal pain, skin rash, fevers, chills, night sweats, weight loss, swollen lymph nodes, body aches, joint swelling, chest pain, shortness of breath, mood changes. POSITIVE muscle aches  Objective  Blood pressure 102/68, pulse 63, height 5\' 1"  (1.549 m), weight 133 lb (60.3 kg), SpO2 97 %.   General: No apparent distress alert and oriented x3 mood and affect normal, dressed appropriately.  HEENT: Pupils equal, extraocular movements intact  Respiratory: Patient's speak in full sentences and does not appear short of breath  Cardiovascular: No lower extremity edema, non tender, no erythema  Severe arthritic changes of multiple joints. Left shoulder does have atrophy noted.  Crepitus noted. Weakness noted   Procedure: Real-time Ultrasound Guided Injection of left glenohumeral joint Device: GE Logiq E  Ultrasound guided injection is preferred based studies that show increased duration, increased effect, greater accuracy, decreased procedural pain, increased response rate with ultrasound guided versus blind injection.  Verbal informed consent obtained.  Time-out conducted.  Noted no overlying erythema, induration, or other signs of local infection.  Skin prepped in a sterile fashion.  Local anesthesia: Topical Ethyl chloride.  With sterile technique and under real time ultrasound guidance:  Joint visualized.  21g 2 inch needle inserted anterior  approach. Pictures taken for needle placement. Patient did have injection of 2 cc of 0.5% Marcaine, and 1cc of Kenalog 40 mg/dL. Completed without difficulty  Pain immediately resolved suggesting accurate placement of the medication.  Advised to call if fevers/chills, erythema, induration, drainage, or persistent bleeding.  Impression: Technically successful ultrasound guided injection.  Procedure: Real-time Ultrasound Guided Injection of left AC joint  Device:  GE Logiq Q7 Ultrasound guided injection is preferred based studies that show increased duration, increased effect, greater accuracy, decreased procedural pain, increased response rate, and decreased cost with ultrasound guided versus blind injection.  Verbal informed consent obtained.  Time-out conducted.  Noted no overlying erythema, induration, or other signs of local infection.  Skin prepped in a sterile fashion.  Local anesthesia: Topical Ethyl chloride.  With sterile technique and under real time ultrasound guidance: With a 25-gauge half inch needle injected with 0.5 cc of 0.5% Marcaine and 0.5 cc of Kenalog 40 mg/mL Completed without difficulty  Pain immediately resolved suggesting accurate placement of the medication.  Advised to call if fevers/chills, erythema, induration, drainage, or persistent bleeding.  Impression: Technically successful ultrasound guided injection.   Impression and Recommendations:    The above documentation has been reviewed and is accurate and complete Judi Saa, DO

## 2022-11-14 ENCOUNTER — Other Ambulatory Visit: Payer: Self-pay

## 2022-11-14 ENCOUNTER — Encounter: Payer: Self-pay | Admitting: Family Medicine

## 2022-11-14 ENCOUNTER — Ambulatory Visit: Payer: Medicare Other | Admitting: Family Medicine

## 2022-11-14 VITALS — BP 102/68 | HR 63 | Ht 61.0 in | Wt 133.0 lb

## 2022-11-14 DIAGNOSIS — M12812 Other specific arthropathies, not elsewhere classified, left shoulder: Secondary | ICD-10-CM

## 2022-11-14 DIAGNOSIS — M19019 Primary osteoarthritis, unspecified shoulder: Secondary | ICD-10-CM | POA: Insufficient documentation

## 2022-11-14 DIAGNOSIS — M19012 Primary osteoarthritis, left shoulder: Secondary | ICD-10-CM

## 2022-11-14 NOTE — Assessment & Plan Note (Signed)
Attempted to injection in the [facility] joint to see if this would help resolve some of the other pain that patient was having.  Discussed icing regimen and home exercises, increase activity slowly.  Follow-up again in 10 weeks

## 2022-11-14 NOTE — Assessment & Plan Note (Signed)
Chronic, with worsening pain.  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  Secondary to patient's age she would like to avoid any surgical intervention.  Can follow-up again in 10 weeks for further evaluation and treatment.

## 2022-11-14 NOTE — Patient Instructions (Signed)
2 injections for shoulder today Good luck with the garden See me again in 10 weeks

## 2023-01-31 ENCOUNTER — Ambulatory Visit: Payer: Medicare Other | Admitting: Family Medicine

## 2023-02-22 NOTE — Progress Notes (Unsigned)
[name] [address] [place] [address] Phone: [phone] Subjective:    I'm seeing this patient by the request  of:  Rodrigo Ran, MD  CC: left shoulder pain   [id number]  11/14/2022 Attempted to injection in the [facility] joint to see if this would help resolve some of the other pain that patient was having.  Discussed icing regimen and home exercises, increase activity slowly.  Follow-up again in 10 weeks   Chronic, with worsening pain.  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  Secondary to patient's age she would like to avoid any surgical intervention.  Can follow-up again in 10 weeks for further evaluation and treatment.      Update 02/26/2023 Theresa Chambers is a 89 y.o. female coming in with complaint of L shoulder pain. Known end stage arthritis of the shoulder.  Patient states     Past Medical History:  Diagnosis Date   History of lumpectomy    benign about 40 years ago in 2016   Hyperlipidemia    Hypertension    Osteopenia    Past Surgical History:  Procedure Laterality Date   TONSILLECTOMY     Social History   Socioeconomic History   Marital status: Married    Spouse name: Not on file   Number of children: Not on file   Years of education: Not on file   Highest education level: Not on file  Occupational History   Not on file  Tobacco Use   Smoking status: Former    Current packs/day: 0.00    Average packs/day: 0.1 packs/day for 20.0 years (2.0 ttl pk-yrs)    Types: Cigarettes    Start date: 07/24/1953    Quit date: 07/24/1973    Years since quitting: 49.6   Smokeless tobacco: Never   Tobacco comments:    quit in 1975  Substance and Sexual Activity   Alcohol use: Yes    Alcohol/week: 3.0 standard drinks of alcohol    Types: 3 Standard drinks or equivalent per week   Drug use: No   Sexual activity: Not Currently    Birth control/protection: None  Other Topics Concern   Not on file  Social History  Narrative   Widowed 2008 due to alzheimers. Lives alone. 2 children. 3 grandchildren (all went to [employer])   Completely independent. 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       Work 3 days a week at [employer].       Hobbies: goes out 3 days a week, goes to [employer], plays cards, goes to movies, walks, church      Social Determinants of Health   Financial Resource Strain: Not on file  Food Insecurity: Not on file  Transportation Needs: Not on file  Physical Activity: Not on file  Stress: Not on file  Social Connections: Not on file   Allergies  Allergen Reactions   Codeine    Penicillins    Family History  Problem Relation Age of Onset   AAA (abdominal aortic aneurysm) Mother        smoker   Hypertension Mother    Breast cancer Neg Hx      Current Outpatient Medications (Cardiovascular):    amLODipine (NORVASC) 5 MG tablet, take 1 tablet by mouth  At bed time   indapamide (LOZOL) 1.25 MG tablet, TAKE ONE TABLET BY MOUTH EVERY MORNING     Current Outpatient Medications (Other):    ALPRAZolam (XANAX) 0.5  MG tablet, Take 0.5 mg by mouth 2 (two) times daily as needed.   doxycycline (VIBRA-TABS) 100 MG tablet, Take 1 tablet (100 mg total) by mouth 2 (two) times daily.   latanoprost (XALATAN) 0.005 % ophthalmic solution,    Multiple Vitamin (MULTIVITAMIN) tablet, Take 1 tablet by mouth daily.     Reviewed prior external information including notes and imaging from  primary care provider As well as notes that were available from care everywhere and other healthcare systems.  Past medical history, social, surgical and family history all reviewed in electronic medical record.  No pertanent information unless stated regarding to the chief complaint.   Review of Systems:  No headache, visual changes, nausea, vomiting, diarrhea, constipation, dizziness, abdominal pain, skin rash, fevers, chills, night sweats, weight loss, swollen lymph nodes, body aches, joint swelling, chest  pain, shortness of breath, mood changes. POSITIVE muscle aches  Objective  There were no vitals taken for this visit.   General: No apparent distress alert and oriented x3 mood and affect normal, dressed appropriately.  HEENT: Pupils equal, extraocular movements intact  Respiratory: Patient's speak in full sentences and does not appear short of breath  Cardiovascular: No lower extremity edema, non tender, no erythema  Left shoulder exam shows     Impression and Recommendations:    The above documentation has been reviewed and is accurate and complete Judi Saa, DO

## 2023-02-27 ENCOUNTER — Ambulatory Visit: Payer: Medicare Other | Admitting: Family Medicine

## 2023-02-27 ENCOUNTER — Encounter: Payer: Self-pay | Admitting: Family Medicine

## 2023-02-27 ENCOUNTER — Other Ambulatory Visit: Payer: Self-pay

## 2023-02-27 VITALS — BP 122/80 | HR 68 | Ht 61.0 in | Wt 129.0 lb

## 2023-02-27 DIAGNOSIS — M12812 Other specific arthropathies, not elsewhere classified, left shoulder: Secondary | ICD-10-CM | POA: Diagnosis not present

## 2023-02-27 DIAGNOSIS — M12811 Other specific arthropathies, not elsewhere classified, right shoulder: Secondary | ICD-10-CM

## 2023-02-27 NOTE — Patient Instructions (Signed)
Injections in both shoulders today Good to see you! See you again in 3 months

## 2023-02-27 NOTE — Assessment & Plan Note (Signed)
Chronic, with exacerbation and now having similar presentation on the right side.  Not quite as severe though.  This is the only new problem.  Discussed icing regimen and home exercises, discussed which activities to do and which ones to avoid.  Increase activity slowly.  Follow-up again in 10 to 12 weeks.

## 2023-05-29 NOTE — Progress Notes (Unsigned)
[name] [address] [place] [address] Phone: [phone] Subjective:   Bruce Donath, am serving as a scribe for Dr. Antoine Primas.  I'm seeing this patient by the request  of:  Rodrigo Ran, MD  CC: Bilateral shoulder pain  [id number]  02/27/2023 Chronic, with exacerbation and now having similar presentation on the right side.  Not quite as severe though.  This is the only new problem.  Discussed icing regimen and home exercises, discussed which activities to do and which ones to avoid.  Increase activity slowly.  Follow-up again in 10 to 12 weeks.      Update 05/30/2023 Theresa Chambers is a 89 y.o. female coming in with complaint of B shoulder pain. Patient states that injections in shoulders have been helpful. She said that when her pain increases she feels off balance because she favors the shoulders.     Past Medical History:  Diagnosis Date   History of lumpectomy    benign about 40 years ago in 2016   Hyperlipidemia    Hypertension    Osteopenia    Past Surgical History:  Procedure Laterality Date   TONSILLECTOMY     Social History   Socioeconomic History   Marital status: Married    Spouse name: Not on file   Number of children: Not on file   Years of education: Not on file   Highest education level: Not on file  Occupational History   Not on file  Tobacco Use   Smoking status: Former    Current packs/day: 0.00    Average packs/day: 0.1 packs/day for 20.0 years (2.0 ttl pk-yrs)    Types: Cigarettes    Start date: 07/24/1953    Quit date: 07/24/1973    Years since quitting: 49.8   Smokeless tobacco: Never   Tobacco comments:    quit in 1975  Substance and Sexual Activity   Alcohol use: Yes    Alcohol/week: 3.0 standard drinks of alcohol    Types: 3 Standard drinks or equivalent per week   Drug use: No   Sexual activity: Not Currently    Birth control/protection: None  Other Topics Concern   Not on file   Social History Narrative   Widowed 2008 due to alzheimers. Lives alone. 2 children. 3 grandchildren (all went to [facility])   Completely independent. 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       Work 3 days a week at [employer].       Hobbies: goes out 3 days a week, goes to [employer], plays cards, goes to movies, walks, church      Social Determinants of Health   Financial Resource Strain: Not on file  Food Insecurity: Not on file  Transportation Needs: Not on file  Physical Activity: Not on file  Stress: Not on file  Social Connections: Not on file   Allergies  Allergen Reactions   Codeine    Penicillins    Family History  Problem Relation Age of Onset   AAA (abdominal aortic aneurysm) Mother        smoker   Hypertension Mother    Breast cancer Neg Hx      Current Outpatient Medications (Cardiovascular):    amLODipine (NORVASC) 5 MG tablet, take 1 tablet by mouth  At bed time   indapamide (LOZOL) 1.25 MG tablet, TAKE ONE TABLET BY MOUTH EVERY MORNING     Current Outpatient Medications (Other):    ALPRAZolam (  XANAX) 0.5 MG tablet, Take 0.5 mg by mouth 2 (two) times daily as needed.   doxycycline (VIBRA-TABS) 100 MG tablet, Take 1 tablet (100 mg total) by mouth 2 (two) times daily.   latanoprost (XALATAN) 0.005 % ophthalmic solution,    Multiple Vitamin (MULTIVITAMIN) tablet, Take 1 tablet by mouth daily.     Reviewed prior external information including notes and imaging from  primary care provider As well as notes that were available from care everywhere and other healthcare systems.  Past medical history, social, surgical and family history all reviewed in electronic medical record.  No pertanent information unless stated regarding to the chief complaint.   Review of Systems:  No headache, visual changes, nausea, vomiting, diarrhea, constipation, dizziness, abdominal pain, skin rash, fevers, chills, night sweats, weight loss, swollen lymph nodes, body aches, joint  swelling, chest pain, shortness of breath, mood changes. POSITIVE muscle aches  Objective  Blood pressure 108/72, pulse 75, height 5\' 1"  (1.549 m), weight 134 lb (60.8 kg), SpO2 97%.   General: No apparent distress alert and oriented x3 mood and affect normal, dressed appropriately.  HEENT: Pupils equal, extraocular movements intact  Respiratory: Patient's speak in full sentences and does not appear short of breath  Cardiovascular: No lower extremity edema, non tender, no erythema  Significant arthritic changes of multiple joints.  Patient's shoulders do have atrophy noted.  Unable to lift arms greater than 90 degrees secondary to this.  Does have crepitus noted.  Procedure: Real-time Ultrasound Guided Injection of right glenohumeral joint Device: GE Logiq Q7  Ultrasound guided injection is preferred based studies that show increased duration, increased effect, greater accuracy, decreased procedural pain, increased response rate with ultrasound guided versus blind injection.  Verbal informed consent obtained.  Time-out conducted.  Noted no overlying erythema, induration, or other signs of local infection.  Skin prepped in a sterile fashion.  Local anesthesia: Topical Ethyl chloride.  With sterile technique and under real time ultrasound guidance:  Joint visualized.  23g 1  inch needle inserted posterior approach. Pictures taken for needle placement. Patient did have injection of  2 cc of 0.5% Marcaine, and 1.0 cc of Kenalog 40 mg/dL. Completed without difficulty  Advised to call if fevers/chills, erythema, induration, drainage, or persistent bleeding.  Impression: Technically successful ultrasound guided injection.  Procedure: Real-time Ultrasound Guided Injection of left glenohumeral joint Device: GE Logiq E  Ultrasound guided injection is preferred based studies that show increased duration, increased effect, greater accuracy, decreased procedural pain, increased response rate with  ultrasound guided versus blind injection.  Verbal informed consent obtained.  Time-out conducted.  Noted no overlying erythema, induration, or other signs of local infection.  Skin prepped in a sterile fashion.  Local anesthesia: Topical Ethyl chloride.  With sterile technique and under real time ultrasound guidance:  Joint visualized.  21g 2 inch needle inserted posterior approach. Pictures taken for needle placement. Patient did have injection of 2 cc of 0.5% Marcaine, and 1cc of Kenalog 40 mg/dL. Completed without difficulty  Pain immediately resolved suggesting accurate placement of the medication.  Advised to call if fevers/chills, erythema, induration, drainage, or persistent bleeding.  Impression: Technically successful ultrasound guided injection.    Impression and Recommendations:     The above documentation has been reviewed and is accurate and complete Judi Saa, DO

## 2023-05-30 ENCOUNTER — Ambulatory Visit: Payer: Medicare Other | Admitting: Family Medicine

## 2023-05-30 ENCOUNTER — Encounter: Payer: Self-pay | Admitting: Family Medicine

## 2023-05-30 ENCOUNTER — Other Ambulatory Visit: Payer: Self-pay

## 2023-05-30 VITALS — BP 108/72 | HR 75 | Ht 61.0 in | Wt 134.0 lb

## 2023-05-30 DIAGNOSIS — M12811 Other specific arthropathies, not elsewhere classified, right shoulder: Secondary | ICD-10-CM

## 2023-05-30 DIAGNOSIS — M25511 Pain in right shoulder: Secondary | ICD-10-CM | POA: Diagnosis not present

## 2023-05-30 DIAGNOSIS — G8929 Other chronic pain: Secondary | ICD-10-CM | POA: Diagnosis not present

## 2023-05-30 DIAGNOSIS — M12812 Other specific arthropathies, not elsewhere classified, left shoulder: Secondary | ICD-10-CM

## 2023-05-30 DIAGNOSIS — M25512 Pain in left shoulder: Secondary | ICD-10-CM | POA: Diagnosis not present

## 2023-05-30 NOTE — Patient Instructions (Signed)
Injections in both shoulders See me again in 10-12 weeks

## 2023-05-30 NOTE — Assessment & Plan Note (Signed)
Chronic, with worsening symptoms.  Secondary to patient's age as well as comorbidities she is not a surgical candidate.  He is responding well though to every 10 to 12 weeks for an injection.  Discussed which activities to do and which ones to avoid.  Increasing activity very slowly.  Follow-up with me again in 10 to 12 weeks social determinant of health includes patient's physical activity is decreased to secondary to the arthritic changes.

## 2023-07-15 IMAGING — DX DG SHOULDER 2+V*L*
3 series · 3 of 3 positions shown · non-contrast
Comparison: None.

CLINICAL DATA: Chronic pain.  Denies injury.

EXAM:
LEFT SHOULDER - 2+ VIEW

[shoulder ap (1 of 2)]
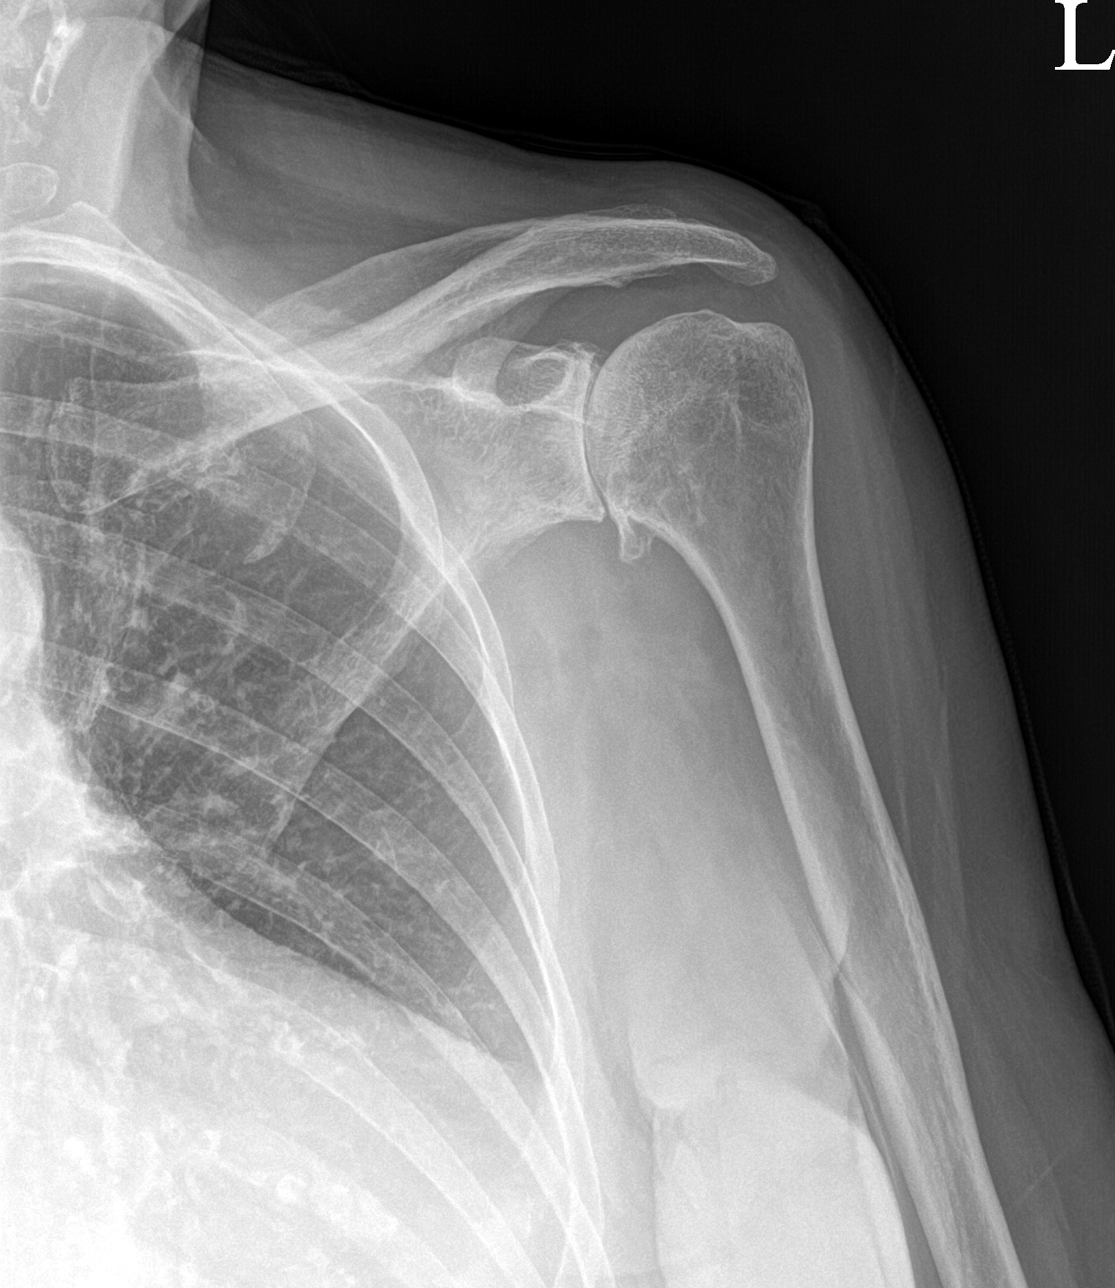

[shoulder ap (2 of 2)]
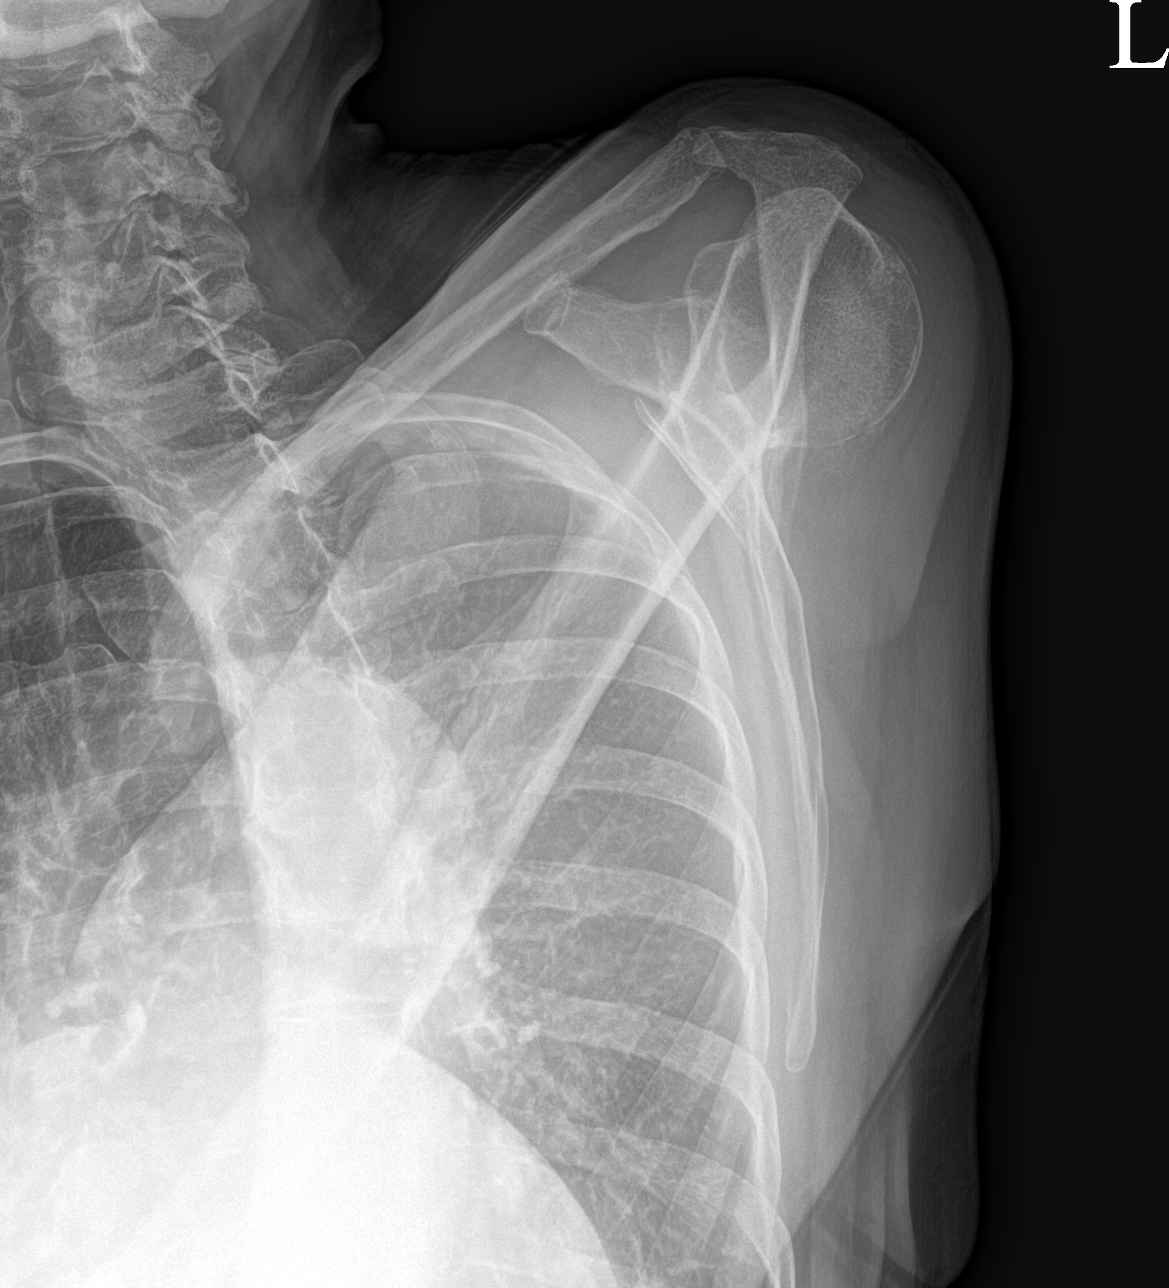

[shoulder axial]
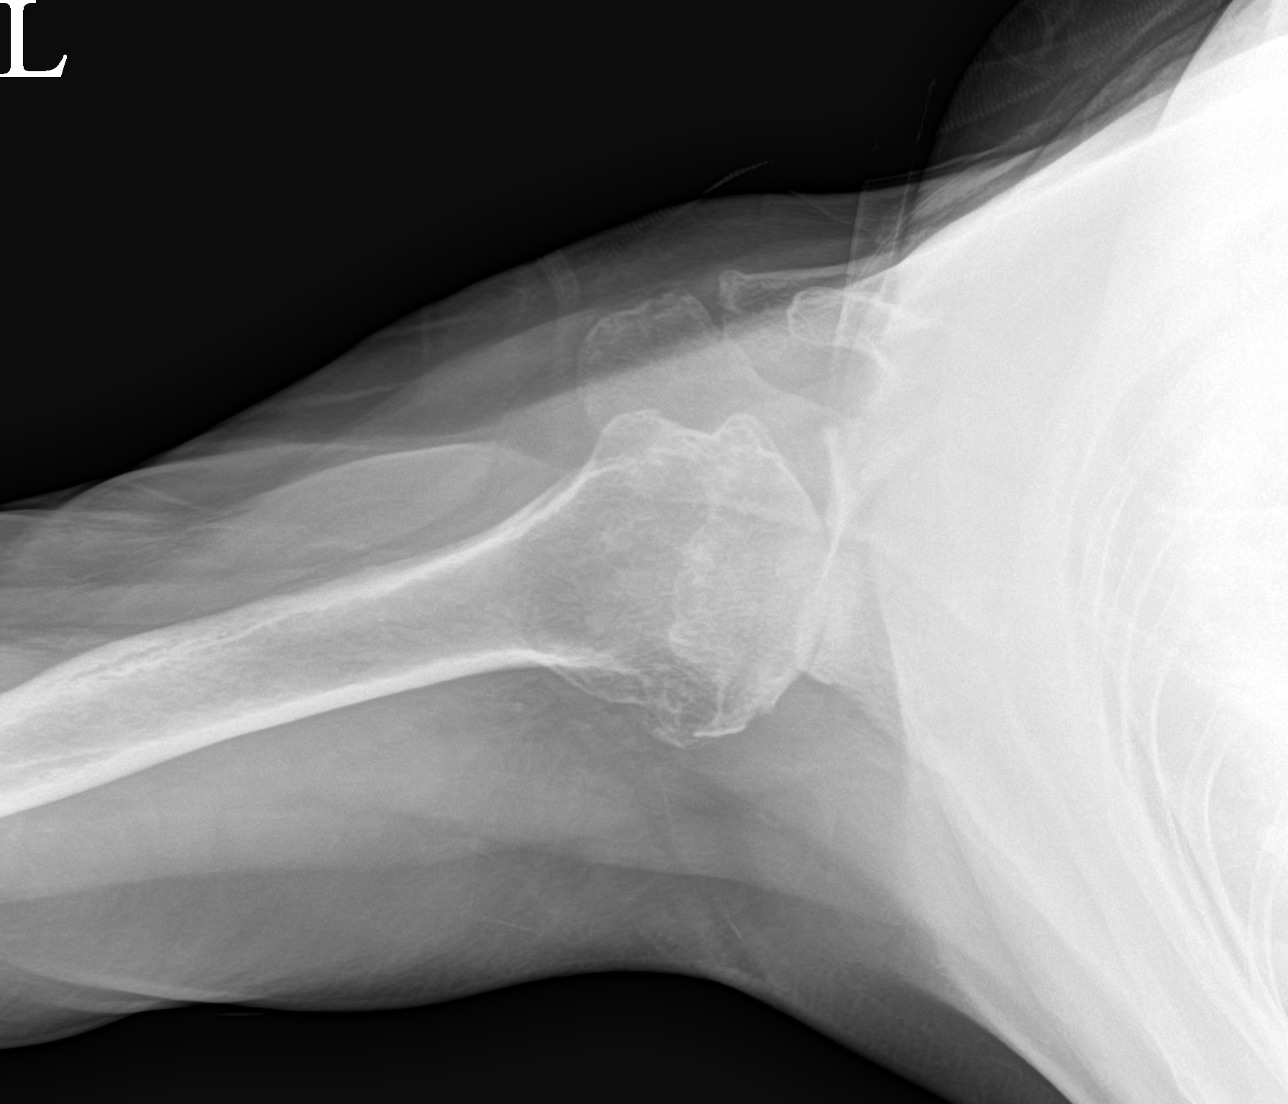

[3 of 3 positions shown; findings below may reference images not displayed]

FINDINGS: There is mild osteopenia without evidence of fractures or
dislocation. The AC joint is normal. There is bone-on-bone
glenohumeral joint space loss with osteophytes, with the most
prominent osteophytes extending from the inferior aspect of the
medial humeral head.

There is preservation of the normal acromiohumeral space. There are
linear calcifications along the posterior humerus which could
indicate changes of chondrocalcinosis or calcific rotator cuff
tendinopathy.
IMPRESSION: 1. Osteopenia and degenerative change without evidence of fractures.
2. Linear calcification along the posterior humeral head which could
be chondrocalcinosis or tendon calcifications.

## 2023-08-09 NOTE — Progress Notes (Deleted)
[facility] [address] [place] [address] Phone: [phone] Subjective:    I'm seeing this patient by the request  of:  Rodrigo Ran, MD  CC:   [id number]  05/30/2023 Chronic, with worsening symptoms. Secondary to patient's age as well as comorbidities she is not a surgical candidate. He is responding well though to every 10 to 12 weeks for an injection. Discussed which activities to do and which ones to avoid. Increasing activity very slowly. Follow-up with me again in 10 to 12 weeks social determinant of health includes patient's physical activity is decreased to secondary to the arthritic changes.   Update 08/14/2023 Theresa Chambers is a 89 y.o. female coming in with complaint of B shoulder pain. Patient states        Past Medical History:  Diagnosis Date   History of lumpectomy    benign about 40 years ago in 2016   Hyperlipidemia    Hypertension    Osteopenia    Past Surgical History:  Procedure Laterality Date   TONSILLECTOMY     Social History   Socioeconomic History   Marital status: Married    Spouse name: Not on file   Number of children: Not on file   Years of education: Not on file   Highest education level: Not on file  Occupational History   Not on file  Tobacco Use   Smoking status: Former    Current packs/day: 0.00    Average packs/day: 0.1 packs/day for 20.0 years (2.0 ttl pk-yrs)    Types: Cigarettes    Start date: 07/24/1953    Quit date: 07/24/1973    Years since quitting: 50.0   Smokeless tobacco: Never   Tobacco comments:    quit in 1975  Substance and Sexual Activity   Alcohol use: Yes    Alcohol/week: 3.0 standard drinks of alcohol    Types: 3 Standard drinks or equivalent per week   Drug use: No   Sexual activity: Not Currently    Birth control/protection: None  Other Topics Concern   Not on file  Social History Narrative   Widowed 2008 due to alzheimers. Lives alone. 2 children. 3  grandchildren (all went to [facility])   Completely independent. 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       Work 3 days a week at [employer].       Hobbies: goes out 3 days a week, goes to [employer], plays cards, goes to movies, walks, church      Social Drivers of Corporate investment banker Strain: Not on file  Food Insecurity: Not on file  Transportation Needs: Not on file  Physical Activity: Not on file  Stress: Not on file  Social Connections: Not on file   Allergies  Allergen Reactions   Codeine    Penicillins    Family History  Problem Relation Age of Onset   AAA (abdominal aortic aneurysm) Mother        smoker   Hypertension Mother    Breast cancer Neg Hx      Current Outpatient Medications (Cardiovascular):    amLODipine (NORVASC) 5 MG tablet, take 1 tablet by mouth  At bed time   indapamide (LOZOL) 1.25 MG tablet, TAKE ONE TABLET BY MOUTH EVERY MORNING     Current Outpatient Medications (Other):    ALPRAZolam (XANAX) 0.5 MG tablet, Take 0.5 mg by mouth 2 (two) times daily as needed.   doxycycline (VIBRA-TABS) 100  MG tablet, Take 1 tablet (100 mg total) by mouth 2 (two) times daily.   latanoprost (XALATAN) 0.005 % ophthalmic solution,    Multiple Vitamin (MULTIVITAMIN) tablet, Take 1 tablet by mouth daily.     Reviewed prior external information including notes and imaging from  primary care provider As well as notes that were available from care everywhere and other healthcare systems.  Past medical history, social, surgical and family history all reviewed in electronic medical record.  No pertanent information unless stated regarding to the chief complaint.   Review of Systems:  No headache, visual changes, nausea, vomiting, diarrhea, constipation, dizziness, abdominal pain, skin rash, fevers, chills, night sweats, weight loss, swollen lymph nodes, body aches, joint swelling, chest pain, shortness of breath, mood changes. POSITIVE muscle aches  Objective   There were no vitals taken for this visit.   General: No apparent distress alert and oriented x3 mood and affect normal, dressed appropriately.  HEENT: Pupils equal, extraocular movements intact  Respiratory: Patient's speak in full sentences and does not appear short of breath  Cardiovascular: No lower extremity edema, non tender, no erythema      Impression and Recommendations:

## 2023-08-14 ENCOUNTER — Ambulatory Visit: Payer: Medicare Other | Admitting: Family Medicine

## 2023-09-05 NOTE — Progress Notes (Unsigned)
[name] [address] [place] [address] Phone: [phone] Subjective:   Bruce Donath, am serving as a scribe for Dr. Antoine Primas.  I'm seeing this patient by the request  of:  Rodrigo Ran, MD  CC: Bilateral shoulder pain  [id number]  05/30/2023 Chronic, with worsening symptoms. Secondary to patient's age as well as comorbidities she is not a surgical candidate. He is responding well though to every 10 to 12 weeks for an injection. Discussed which activities to do and which ones to avoid. Increasing activity very slowly. Follow-up with me again in 10 to 12 weeks social determinant of health includes patient's physical activity is decreased to secondary to the arthritic changes.   Update 09/06/2023 Theresa Chambers is a 89 y.o. female coming in with complaint of B shoulder pain. Patient states that R bicep is hurting. B shoulder joints are painful.       Past Medical History:  Diagnosis Date   History of lumpectomy    benign about 40 years ago in 2016   Hyperlipidemia    Hypertension    Osteopenia    Past Surgical History:  Procedure Laterality Date   TONSILLECTOMY     Social History   Socioeconomic History   Marital status: Married    Spouse name: Not on file   Number of children: Not on file   Years of education: Not on file   Highest education level: Not on file  Occupational History   Not on file  Tobacco Use   Smoking status: Former    Current packs/day: 0.00    Average packs/day: 0.1 packs/day for 20.0 years (2.0 ttl pk-yrs)    Types: Cigarettes    Start date: 07/24/1953    Quit date: 07/24/1973    Years since quitting: 50.1   Smokeless tobacco: Never   Tobacco comments:    quit in 1975  Substance and Sexual Activity   Alcohol use: Yes    Alcohol/week: 3.0 standard drinks of alcohol    Types: 3 Standard drinks or equivalent per week   Drug use: No   Sexual activity: Not Currently    Birth  control/protection: None  Other Topics Concern   Not on file  Social History Narrative   Widowed 2008 due to alzheimers. Lives alone. 2 children. 3 grandchildren (all went to [facility])   Completely independent. 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       Work 3 days a week at [employer].       Hobbies: goes out 3 days a week, goes to [employer], plays cards, goes to movies, walks, church      Social Drivers of Corporate investment banker Strain: Not on file  Food Insecurity: Not on file  Transportation Needs: Not on file  Physical Activity: Not on file  Stress: Not on file  Social Connections: Not on file   Allergies  Allergen Reactions   Codeine    Penicillins    Family History  Problem Relation Age of Onset   AAA (abdominal aortic aneurysm) Mother        smoker   Hypertension Mother    Breast cancer Neg Hx      Current Outpatient Medications (Cardiovascular):    amLODipine (NORVASC) 5 MG tablet, take 1 tablet by mouth  At bed time   indapamide (LOZOL) 1.25 MG tablet, TAKE ONE TABLET BY MOUTH EVERY MORNING     Current Outpatient Medications (Other):  ALPRAZolam (XANAX) 0.5 MG tablet, Take 0.5 mg by mouth 2 (two) times daily as needed.   doxycycline (VIBRA-TABS) 100 MG tablet, Take 1 tablet (100 mg total) by mouth 2 (two) times daily.   latanoprost (XALATAN) 0.005 % ophthalmic solution,    Multiple Vitamin (MULTIVITAMIN) tablet, Take 1 tablet by mouth daily.     Reviewed prior external information including notes and imaging from  primary care provider As well as notes that were available from care everywhere and other healthcare systems.  Past medical history, social, surgical and family history all reviewed in electronic medical record.  No pertanent information unless stated regarding to the chief complaint.   Review of Systems:  No headache, visual changes, nausea, vomiting, diarrhea, constipation, dizziness, abdominal pain, skin rash, fevers, chills, night  sweats, weight loss, swollen lymph nodes, body aches, joint swelling, chest pain, shortness of breath, mood changes. POSITIVE muscle aches  Objective  Blood pressure 110/74, pulse 64, height 5\' 1"  (1.549 m), weight 138 lb (62.6 kg), SpO2 95%.   General: No apparent distress alert and oriented x3 mood and affect normal, dressed appropriately.  HEENT: Pupils equal, extraocular movements intact  Respiratory: Patient's speak in full sentences and does not appear short of breath  Cardiovascular: No lower extremity edema, non tender, no erythema  Bilateral shoulders do have crepitus noted.  Some weakness with limited flexion rotator cuff bilaterally.  Procedure: Real-time Ultrasound Guided Injection of right glenohumeral joint Device: GE Logiq Q7  Ultrasound guided injection is preferred based studies that show increased duration, increased effect, greater accuracy, decreased procedural pain, increased response rate with ultrasound guided versus blind injection.  Verbal informed consent obtained.  Time-out conducted.  Noted no overlying erythema, induration, or other signs of local infection.  Skin prepped in a sterile fashion.  Local anesthesia: Topical Ethyl chloride.  With sterile technique and under real time ultrasound guidance:  Joint visualized.  23g 1  inch needle inserted posterior approach. Pictures taken for needle placement. Patient did have injection of 2 cc of 1% lidocaine, 2 cc of 0.5% Marcaine, and 1.0 cc of Kenalog 40 mg/dL. Completed without difficulty  Pain immediately resolved suggesting accurate placement of the medication.  Images permanently stored Advised to call if fevers/chills, erythema, induration, drainage, or persistent bleeding.  Impression: Technically successful ultrasound guided injection.  Procedure: Real-time Ultrasound Guided Injection of left glenohumeral joint Device: GE Logiq E  Ultrasound guided injection is preferred based studies that show increased  duration, increased effect, greater accuracy, decreased procedural pain, increased response rate with ultrasound guided versus blind injection.  Verbal informed consent obtained.  Time-out conducted.  Noted no overlying erythema, induration, or other signs of local infection.  Skin prepped in a sterile fashion.  Local anesthesia: Topical Ethyl chloride.  With sterile technique and under real time ultrasound guidance:  Joint visualized.  21g 2 inch needle inserted posterior approach. Pictures taken for needle placement. Patient did have injection of 2 cc of 0.5% Marcaine, and 1cc of Kenalog 40 mg/dL. Completed without difficulty  Pain immediately resolved suggesting accurate placement of the medication.  Advised to call if fevers/chills, erythema, induration, drainage, or persistent bleeding.  Images permanently stored  Impression: Technically successful ultrasound guided injection.   Impression and Recommendations:    The above documentation has been reviewed and is accurate and complete Judi Saa, DO

## 2023-09-06 ENCOUNTER — Encounter: Payer: Self-pay | Admitting: Family Medicine

## 2023-09-06 ENCOUNTER — Ambulatory Visit: Payer: Medicare Other | Admitting: Family Medicine

## 2023-09-06 ENCOUNTER — Other Ambulatory Visit: Payer: Self-pay

## 2023-09-06 VITALS — BP 110/74 | HR 64 | Ht 61.0 in | Wt 138.0 lb

## 2023-09-06 DIAGNOSIS — M25512 Pain in left shoulder: Secondary | ICD-10-CM | POA: Diagnosis not present

## 2023-09-06 DIAGNOSIS — M12811 Other specific arthropathies, not elsewhere classified, right shoulder: Secondary | ICD-10-CM | POA: Diagnosis not present

## 2023-09-06 DIAGNOSIS — G8929 Other chronic pain: Secondary | ICD-10-CM | POA: Diagnosis not present

## 2023-09-06 DIAGNOSIS — M12812 Other specific arthropathies, not elsewhere classified, left shoulder: Secondary | ICD-10-CM | POA: Diagnosis not present

## 2023-09-06 DIAGNOSIS — M25511 Pain in right shoulder: Secondary | ICD-10-CM

## 2023-09-06 NOTE — Patient Instructions (Signed)
Injected both shoulders today See me again in 10-12 weeks

## 2023-09-06 NOTE — Assessment & Plan Note (Signed)
Chronic, with exacerbation.  Social determinant health includes patient is unable to be active secondary to other comorbidities including arthritic changes of multiple joints.  Discussed icing regimen and home exercises, discussed which activities to do.  Increase activity slowly.  Discussed icing regimen and home exercise.  Follow-up with me again 10 to 12 weeks otherwise.

## 2023-11-15 ENCOUNTER — Ambulatory Visit: Payer: Medicare Other | Admitting: Family Medicine

## 2023-12-10 NOTE — Progress Notes (Signed)
 [facility] [address] [place] [address] Phone: [phone] Subjective:   Theresa Chambers, am serving as a scribe for Dr. Ronnell Coins.  I'm seeing this patient by the request  of:  Aldo Hun, MD  CC: Bilateral shoulder pain  [id number]  09/06/2023 Chronic, with exacerbation.  Social determinant health includes patient is unable to be active secondary to other comorbidities including arthritic changes of multiple joints.  Discussed icing regimen and home exercises, discussed which activities to do.  Increase activity slowly.  Discussed icing regimen and home exercise.  Follow-up with me again 10 to 12 weeks otherwise.      Update 12/12/2023 Theresa Chambers is a 89 y.o. female coming in with complaint of B shoulder pain. Patient states that both shoulders are painful today. Injections are helpful.        Past Medical History:  Diagnosis Date   History of lumpectomy    benign about 40 years ago in 2016   Hyperlipidemia    Hypertension    Osteopenia    Past Surgical History:  Procedure Laterality Date   TONSILLECTOMY     Social History   Socioeconomic History   Marital status: Married    Spouse name: Not on file   Number of children: Not on file   Years of education: Not on file   Highest education level: Not on file  Occupational History   Not on file  Tobacco Use   Smoking status: Former    Current packs/day: 0.00    Average packs/day: 0.1 packs/day for 20.0 years (2.0 ttl pk-yrs)    Types: Cigarettes    Start date: 07/24/1953    Quit date: 07/24/1973    Years since quitting: 50.4   Smokeless tobacco: Never   Tobacco comments:    quit in 1975  Substance and Sexual Activity   Alcohol use: Yes    Alcohol/week: 3.0 standard drinks of alcohol    Types: 3 Standard drinks or equivalent per week   Drug use: No   Sexual activity: Not Currently    Birth control/protection: None  Other Topics Concern   Not on file   Social History Narrative   Widowed 2008 due to alzheimers. Lives alone. 2 children. 3 grandchildren (all went to [facility])   Completely independent. 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       Work 3 days a week at [employer].       Hobbies: goes out 3 days a week, goes to [employer], plays cards, goes to movies, walks, church      Social Drivers of Corporate investment banker Strain: Not on file  Food Insecurity: Not on file  Transportation Needs: Not on file  Physical Activity: Not on file  Stress: Not on file  Social Connections: Not on file   Allergies  Allergen Reactions   Codeine    Penicillins    Family History  Problem Relation Age of Onset   AAA (abdominal aortic aneurysm) Mother        smoker   Hypertension Mother    Breast cancer Neg Hx      Current Outpatient Medications (Cardiovascular):    amLODipine  (NORVASC ) 5 MG tablet, take 1 tablet by mouth  At bed time   indapamide  (LOZOL ) 1.25 MG tablet, TAKE ONE TABLET BY MOUTH EVERY MORNING     Current Outpatient Medications (Other):    ALPRAZolam  (XANAX ) 0.5 MG tablet, Take 0.5 mg by  mouth 2 (two) times daily as needed.   doxycycline  (VIBRA -TABS) 100 MG tablet, Take 1 tablet (100 mg total) by mouth 2 (two) times daily.   latanoprost (XALATAN) 0.005 % ophthalmic solution,    Multiple Vitamin (MULTIVITAMIN) tablet, Take 1 tablet by mouth daily.     Reviewed prior external information including notes and imaging from  primary care provider As well as notes that were available from care everywhere and other healthcare systems.  Past medical history, social, surgical and family history all reviewed in electronic medical record.  No pertanent information unless stated regarding to the chief complaint.   Review of Systems:  No headache, visual changes, nausea, vomiting, diarrhea, constipation, dizziness, abdominal pain, skin rash, fevers, chills, night sweats, weight loss, swollen lymph nodes, body aches, joint  swelling, chest pain, shortness of breath, mood changes. POSITIVE muscle aches  Objective  Blood pressure (!) 138/90, pulse 73, height 5\' 1"  (1.549 m), weight 143 lb (64.9 kg), SpO2 97%.   General: No apparent distress alert and oriented x3 mood and affect normal, dressed appropriately.  HEENT: Pupils equal, extraocular movements intact  Respiratory: Patient's speak in full sentences and does not appear short of breath  Cardiovascular: No lower extremity edema, non tender, no erythema  Patient does have some atrophy of the shoulders noted bilaterally.  Does have crepitus noted.  Some limited range of motion in each plane.  3+ out of 5 strength noted.  Procedure: Real-time Ultrasound Guided Injection of left glenohumeral joint Device: GE Logiq E  Ultrasound guided injection is preferred based studies that show increased duration, increased effect, greater accuracy, decreased procedural pain, increased response rate with ultrasound guided versus blind injection.  Verbal informed consent obtained.  Time-out conducted.  Noted no overlying erythema, induration, or other signs of local infection.  Skin prepped in a sterile fashion.  Local anesthesia: Topical Ethyl chloride.  With sterile technique and under real time ultrasound guidance:  Joint visualized.  21g 2 inch needle inserted posterior approach. Pictures taken for needle placement. Patient did have injection of 2 cc of 0.5% Marcaine, and 1cc of Kenalog 40 mg/dL. Completed without difficulty  Pain immediately resolved suggesting accurate placement of the medication.  Advised to call if fevers/chills, erythema, induration, drainage, or persistent bleeding.  Images permanently stored  Impression: Technically successful ultrasound guided injection.  Procedure: Real-time Ultrasound Guided Injection of right glenohumeral joint Device: GE Logiq Q7  Ultrasound guided injection is preferred based studies that show increased duration, increased  effect, greater accuracy, decreased procedural pain, increased response rate with ultrasound guided versus blind injection.  Verbal informed consent obtained.  Time-out conducted.  Noted no overlying erythema, induration, or other signs of local infection.  Skin prepped in a sterile fashion.  Local anesthesia: Topical Ethyl chloride.  With sterile technique and under real time ultrasound guidance:  Joint visualized.  23g 1  inch needle inserted posterior approach. Pictures taken for needle placement. Patient did have injection of 2 cc of 1% lidocaine, 2 cc of 0.5% Marcaine, and 1.0 cc of Kenalog 40 mg/dL. Completed without difficulty  Pain immediately resolved suggesting accurate placement of the medication.  Advised to call if fevers/chills, erythema, induration, drainage, or persistent bleeding.  Images saved Impression: Technically successful ultrasound guided injection.     Impression and Recommendations:    The above documentation has been reviewed and is accurate and complete Davon Folta M Koltan Portocarrero, DO

## 2023-12-12 ENCOUNTER — Ambulatory Visit: Admitting: Family Medicine

## 2023-12-12 ENCOUNTER — Encounter: Payer: Self-pay | Admitting: Family Medicine

## 2023-12-12 ENCOUNTER — Other Ambulatory Visit: Payer: Self-pay

## 2023-12-12 VITALS — BP 138/90 | HR 73 | Ht 61.0 in | Wt 143.0 lb

## 2023-12-12 DIAGNOSIS — M12811 Other specific arthropathies, not elsewhere classified, right shoulder: Secondary | ICD-10-CM

## 2023-12-12 DIAGNOSIS — M25512 Pain in left shoulder: Secondary | ICD-10-CM

## 2023-12-12 DIAGNOSIS — M25511 Pain in right shoulder: Secondary | ICD-10-CM

## 2023-12-12 DIAGNOSIS — M12812 Other specific arthropathies, not elsewhere classified, left shoulder: Secondary | ICD-10-CM | POA: Diagnosis not present

## 2023-12-12 NOTE — Assessment & Plan Note (Signed)
 Patient given injection bilaterally.  Discussed icing regimen and home exercises, which activities to do and which ones to avoid.  Patient is trying to avoid any type of surgical intervention if possible.  Discussed icing regimen and home exercises.  Discussed which activities to do and which ones to avoid.  Increase activity slowly.  Follow-up again in 3 months

## 2023-12-12 NOTE — Patient Instructions (Signed)
 See me again in 10-12 weeks Injected both shoulders

## 2024-02-19 NOTE — Progress Notes (Unsigned)
 [name] [address] [place] [address] Phone: [phone] Subjective:   Theresa Chambers Theresa Chambers, am serving as a scribe for Dr. Arthea Claudene.  I'm seeing this patient by the request  of:  Shayne Anes, MD  CC: Bilateral shoulder pain  [id number]  12/12/2023 Patient given injection bilaterally.  Discussed icing regimen and home exercises, which activities to do and which ones to avoid.  Patient is trying to avoid any type of surgical intervention if possible.  Discussed icing regimen and home exercises.  Discussed which activities to do and which ones to avoid.  Increase activity slowly.  Follow-up again in 3 months      Update 02/20/2024 Theresa Chambers is a 89 y.o. female coming in with complaint of B shoulder pain. Patient states that she started having pain 3 days ago. Has not taken any Advil since last visit. Using ice and topical analgesics.       Past Medical History:  Diagnosis Date   History of lumpectomy    benign about 40 years ago in 2016   Hyperlipidemia    Hypertension    Osteopenia    Past Surgical History:  Procedure Laterality Date   TONSILLECTOMY     Social History   Socioeconomic History   Marital status: Married    Spouse name: Not on file   Number of children: Not on file   Years of education: Not on file   Highest education level: Not on file  Occupational History   Not on file  Tobacco Use   Smoking status: Former    Current packs/day: 0.00    Average packs/day: 0.1 packs/day for 20.0 years (2.0 ttl pk-yrs)    Types: Cigarettes    Start date: 07/24/1953    Quit date: 07/24/1973    Years since quitting: 50.6   Smokeless tobacco: Never   Tobacco comments:    quit in 1975  Substance and Sexual Activity   Alcohol use: Yes    Alcohol/week: 3.0 standard drinks of alcohol    Types: 3 Standard drinks or equivalent per week   Drug use: No   Sexual activity: Not Currently    Birth control/protection: None   Other Topics Concern   Not on file  Social History Narrative   Widowed 2008 due to alzheimers. Lives alone. 2 children. 3 grandchildren (all went to [facility])   Completely independent. 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       Work 3 days a week at [employer].       Hobbies: goes out 3 days a week, goes to [employer], plays cards, goes to movies, walks, church      Social Drivers of Corporate investment banker Strain: Not on file  Food Insecurity: Not on file  Transportation Needs: Not on file  Physical Activity: Not on file  Stress: Not on file  Social Connections: Not on file   Allergies  Allergen Reactions   Codeine    Penicillins    Family History  Problem Relation Age of Onset   AAA (abdominal aortic aneurysm) Mother        smoker   Hypertension Mother    Breast cancer Neg Hx      Current Outpatient Medications (Cardiovascular):    amLODipine  (NORVASC ) 5 MG tablet, take 1 tablet by mouth  At bed time   indapamide  (LOZOL ) 1.25 MG tablet, TAKE ONE TABLET BY MOUTH EVERY MORNING     Current  Outpatient Medications (Other):    ALPRAZolam  (XANAX ) 0.5 MG tablet, Take 0.5 mg by mouth 2 (two) times daily as needed.   doxycycline  (VIBRA -TABS) 100 MG tablet, Take 1 tablet (100 mg total) by mouth 2 (two) times daily.   latanoprost (XALATAN) 0.005 % ophthalmic solution,    Multiple Vitamin (MULTIVITAMIN) tablet, Take 1 tablet by mouth daily.     Reviewed prior external information including notes and imaging from  primary care provider As well as notes that were available from care everywhere and other healthcare systems.  Past medical history, social, surgical and family history all reviewed in electronic medical record.  No pertanent information unless stated regarding to the chief complaint.   Review of Systems:  No headache, visual changes, nausea, vomiting, diarrhea, constipation, dizziness, abdominal pain, skin rash, fevers, chills, night sweats, weight loss,  swollen lymph nodes, body aches, joint swelling, chest pain, shortness of breath, mood changes. POSITIVE muscle aches  Objective  Blood pressure 110/80, pulse 74, height 5' 1 (1.549 m), weight 140 lb (63.5 kg), SpO2 97%.   General: No apparent distress alert and oriented x3 mood and affect normal, dressed appropriately.  HEENT: Pupils equal, extraocular movements intact  Respiratory: Patient's speak in full sentences and does not appear short of breath  Cardiovascular: No lower extremity edema, non tender, no erythema  Bilateral shoulder exam shows   Procedure: Real-time Ultrasound Guided Injection of right glenohumeral joint Device: GE Logiq Q7  Ultrasound guided injection is preferred based studies that show increased duration, increased effect, greater accuracy, decreased procedural pain, increased response rate with ultrasound guided versus blind injection.  Verbal informed consent obtained.  Time-out conducted.  Noted no overlying erythema, induration, or other signs of local infection.  Skin prepped in a sterile fashion.  Local anesthesia: Topical Ethyl chloride.  With sterile technique and under real time ultrasound guidance:  Joint visualized.  23g 1  inch needle inserted posterior approach. Pictures taken for needle placement. Patient did have injection of 2 cc of 1% lidocaine, 2 cc of 0.5% Marcaine, and 1.0 cc of Kenalog 40 mg/dL. Completed without difficulty  Pain immediately resolved suggesting accurate placement of the medication.  Advised to call if fevers/chills, erythema, induration, drainage, or persistent bleeding.  Images saved Impression: Technically successful ultrasound guided injection.  Procedure: Real-time Ultrasound Guided Injection of left glenohumeral joint Device: GE Logiq E  Ultrasound guided injection is preferred based studies that show increased duration, increased effect, greater accuracy, decreased procedural pain, increased response rate with  ultrasound guided versus blind injection.  Verbal informed consent obtained.  Time-out conducted.  Noted no overlying erythema, induration, or other signs of local infection.  Skin prepped in a sterile fashion.  Local anesthesia: Topical Ethyl chloride.  With sterile technique and under real time ultrasound guidance:  Joint visualized.  21g 2 inch needle inserted posterior approach. Pictures taken for needle placement. Patient did have injection of 2 cc of 0.5% Marcaine, and 1cc of Kenalog 40 mg/dL. Completed without difficulty  Pain immediately resolved suggesting accurate placement of the medication.  Advised to call if fevers/chills, erythema, induration, drainage, or persistent bleeding.  Images permanently stored Impression: Technically successful ultrasound guided injection.   Impression and Recommendations:    The above documentation has been reviewed and is accurate and complete Felix Meras M Audrielle Vankuren, DO

## 2024-02-20 ENCOUNTER — Ambulatory Visit: Admitting: Family Medicine

## 2024-02-20 ENCOUNTER — Encounter: Payer: Self-pay | Admitting: Family Medicine

## 2024-02-20 ENCOUNTER — Other Ambulatory Visit: Payer: Self-pay

## 2024-02-20 VITALS — BP 110/80 | HR 74 | Ht 61.0 in | Wt 140.0 lb

## 2024-02-20 DIAGNOSIS — M12812 Other specific arthropathies, not elsewhere classified, left shoulder: Secondary | ICD-10-CM

## 2024-02-20 DIAGNOSIS — M25511 Pain in right shoulder: Secondary | ICD-10-CM

## 2024-02-20 DIAGNOSIS — M12811 Other specific arthropathies, not elsewhere classified, right shoulder: Secondary | ICD-10-CM

## 2024-02-20 DIAGNOSIS — M25512 Pain in left shoulder: Secondary | ICD-10-CM

## 2024-02-20 NOTE — Assessment & Plan Note (Signed)
 Chronic, with exacerbation.  Bilateral injections given again today.  Tolerated the procedure well.  Due to patient's age and comorbidities not a surgical candidate.  Seems to do well for 8 to 10 weeks at a time.  Will continue to monitor.  May need to consider acromioclavicular injections as well.

## 2024-02-20 NOTE — Patient Instructions (Addendum)
 Injected both shoulders Great to see you Enjoy Burnetta Dent See me in 3 months

## 2024-05-20 NOTE — Progress Notes (Unsigned)
 [facility] [address] [place] [address] Phone: [phone] Subjective:   Theresa Chambers, am serving as a scribe for Dr. Arthea Claudene.  I'm seeing this patient by the request  of:  Theresa Anes, MD  CC:  Bilateral shoulder pain YEP:Dlagzrupcz  02/20/2024 Chronic, with exacerbation.  Bilateral injections given again today.  Tolerated the procedure well.  Due to patient's age and comorbidities not a surgical candidate.  Seems to do well for 8 to 10 weeks at a time.  Will continue to monitor.  May need to consider acromioclavicular injections as well.       Update 05/22/2024 Theresa Chambers is a 89 y.o. female coming in with complaint of B shoulder pain. Patient states that her pain is radiating down into both biceps. Injections last visit did not work as well as the previous injections.       Past Medical History:  Diagnosis Date   History of lumpectomy    benign about 40 years ago in 2016   Hyperlipidemia    Hypertension    Osteopenia    Past Surgical History:  Procedure Laterality Date   TONSILLECTOMY     Social History   Socioeconomic History   Marital status: Married    Spouse name: Not on file   Number of children: Not on file   Years of education: Not on file   Highest education level: Not on file  Occupational History   Not on file  Tobacco Use   Smoking status: Former    Current packs/day: 0.00    Average packs/day: 0.1 packs/day for 20.0 years (2.0 ttl pk-yrs)    Types: Cigarettes    Start date: 07/24/1953    Quit date: 07/24/1973    Years since quitting: 50.8   Smokeless tobacco: Never   Tobacco comments:    quit in 1975  Substance and Sexual Activity   Alcohol use: Yes    Alcohol/week: 3.0 standard drinks of alcohol    Types: 3 Standard drinks or equivalent per week   Drug use: No   Sexual activity: Not Currently    Birth control/protection: None  Other Topics Concern   Not on file  Social History  Narrative   Widowed 2008 due to alzheimers. Lives alone. 2 children. 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       Work 3 days a week at [employer].       Hobbies: goes out 3 days a week, goes to [employer], plays cards, goes to movies, walks, church      Social Drivers of Corporate Investment Banker Strain: Not on file  Food Insecurity: Not on file  Transportation Needs: Not on file  Physical Activity: Not on file  Stress: Not on file  Social Connections: Not on file   Allergies  Allergen Reactions   Codeine    Penicillins    Family History  Problem Relation Age of Onset   AAA (abdominal aortic aneurysm) Mother        smoker   Hypertension Mother    Breast cancer Neg Hx      Current Outpatient Medications (Cardiovascular):    amLODipine  (NORVASC ) 5 MG tablet, take 1 tablet by mouth  At bed time   indapamide  (LOZOL ) 1.25 MG tablet, TAKE ONE TABLET BY MOUTH EVERY MORNING     Current Outpatient Medications (Other):    ALPRAZolam  (XANAX )  0.5 MG tablet, Take 0.5 mg by mouth 2 (two) times daily as needed.   doxycycline  (VIBRA -TABS) 100 MG tablet, Take 1 tablet (100 mg total) by mouth 2 (two) times daily.   latanoprost (XALATAN) 0.005 % ophthalmic solution,    Multiple Vitamin (MULTIVITAMIN) tablet, Take 1 tablet by mouth daily.     Reviewed prior external information including notes and imaging from  primary care provider As well as notes that were available from care everywhere and other healthcare systems.  Past medical history, social, surgical and family history all reviewed in electronic medical record.  No pertanent information unless stated regarding to the chief complaint.   Review of Systems:  No headache, visual changes, nausea, vomiting, diarrhea, constipation, dizziness, abdominal pain, skin rash, fevers, chills, night sweats, weight loss, swollen lymph nodes, body aches, joint swelling, chest pain,  shortness of breath, mood changes. POSITIVE muscle aches  Objective  Blood pressure 128/82, pulse 74, height 5' 1 (1.549 m), weight 140 lb (63.5 kg), SpO2 98%.   General: No apparent distress alert and oriented x3 mood and affect normal, dressed appropriately.  HEENT: Pupils equal, extraocular movements intact  Respiratory: Patient's speak in full sentences and does not appear short of breath  Severe arthritic changes of multiple joints but patient shoulder does have atrophy noted.  No significant crepitus with range of motion noted.  Weakness of the rotator cuff of 3 out of 5 but symmetric. Procedure: Real-time Ultrasound Guided Injection of right glenohumeral joint Device: GE Logiq Q7  Ultrasound guided injection is preferred based studies that show increased duration, increased effect, greater accuracy, decreased procedural pain, increased response rate with ultrasound guided versus blind injection.  Verbal informed consent obtained.  Time-out conducted.  Noted no overlying erythema, induration, or other signs of local infection.  Skin prepped in a sterile fashion.  Local anesthesia: Topical Ethyl chloride.  With sterile technique and under real time ultrasound guidance:  Joint visualized.  23g 1  inch needle inserted posterior approach. Pictures taken for needle placement. Patient did have injection of  2 cc of 0.5% Marcaine, and 1.0 cc of Depo-Medrol  40 mg/dL. Completed without difficulty  Pain immediately resolved suggesting accurate placement of the medication.  Advised to call if fevers/chills, erythema, induration, drainage, or persistent bleeding.  Impression: Technically successful ultrasound guided injection.  Procedure: Real-time Ultrasound Guided Injection of left glenohumeral joint Device: GE Logiq E  Ultrasound guided injection is preferred based studies that show increased duration, increased effect, greater accuracy, decreased procedural pain, increased response rate with  ultrasound guided versus blind injection.  Verbal informed consent obtained.  Time-out conducted.  Noted no overlying erythema, induration, or other signs of local infection.  Skin prepped in a sterile fashion.  Local anesthesia: Topical Ethyl chloride.  With sterile technique and under real time ultrasound guidance:  Joint visualized.  21g 2 inch needle inserted posterior approach. Pictures taken for needle placement. Patient did have injection of 2 cc of 0.5% Marcaine, and 1cc of Depo-Medrol  40 mg/dL. Completed without difficulty  Pain immediately resolved suggesting accurate placement of the medication.  Advised to call if fevers/chills, erythema, induration, drainage, or persistent bleeding.  Images permanently stored  Impression: Technically successful ultrasound guided injection.     Impression and Recommendations:    The above documentation has been reviewed and is accurate and complete Magdalena Skilton M Jodell Weitman, DO

## 2024-05-22 ENCOUNTER — Encounter: Payer: Self-pay | Admitting: Family Medicine

## 2024-05-22 ENCOUNTER — Ambulatory Visit: Admitting: Family Medicine

## 2024-05-22 ENCOUNTER — Other Ambulatory Visit: Payer: Self-pay

## 2024-05-22 VITALS — BP 128/82 | HR 74 | Ht 61.0 in | Wt 140.0 lb

## 2024-05-22 DIAGNOSIS — M25512 Pain in left shoulder: Secondary | ICD-10-CM

## 2024-05-22 DIAGNOSIS — M12812 Other specific arthropathies, not elsewhere classified, left shoulder: Secondary | ICD-10-CM

## 2024-05-22 DIAGNOSIS — M12811 Other specific arthropathies, not elsewhere classified, right shoulder: Secondary | ICD-10-CM | POA: Diagnosis not present

## 2024-05-22 DIAGNOSIS — G8929 Other chronic pain: Secondary | ICD-10-CM

## 2024-05-22 DIAGNOSIS — M25511 Pain in right shoulder: Secondary | ICD-10-CM

## 2024-05-22 MED ORDER — METHYLPREDNISOLONE ACETATE 40 MG/ML IJ SUSP
80.0000 mg | Freq: Once | INTRAMUSCULAR | Status: AC
  Administered 2024-05-22: 80 mg via INTRAMUSCULAR

## 2024-05-22 NOTE — Patient Instructions (Signed)
 Injected both shoulders  See me again in 10 weeks

## 2024-05-22 NOTE — Assessment & Plan Note (Signed)
 Bilateral arthritic changes noted.  End-stage bone-on-bone.  Discussed icing regimen, can give injections every 10 weeks if necessary.  Increase activity slowly.  Follow-up again in 10 weeks.

## 2024-07-16 NOTE — Progress Notes (Signed)
 " [facility] [address] [place] [address] Phone: [phone] Subjective:   Theresa Chambers, am serving as a scribe for Dr. Arthea Claudene.  I'm seeing this patient by the request  of:  Shayne Anes, MD  CC: Bilateral shoulder pain  YEP:Dlagzrupcz  05/22/2024 Bilateral arthritic changes noted.  End-stage bone-on-bone.  Discussed icing regimen, can give injections every 10 weeks if necessary.  Increase activity slowly.  Follow-up again in 10 weeks.      Update 07/22/2024 Theresa Chambers is a 89 y.o. female coming in with complaint of B shoulder pain. Patient states same old shoulder problems. Very annoying.  Affecting all daily activities including dressing.       Past Medical History:  Diagnosis Date   History of lumpectomy    benign about 40 years ago in 2016   Hyperlipidemia    Hypertension    Osteopenia    Past Surgical History:  Procedure Laterality Date   TONSILLECTOMY     Social History   Socioeconomic History   Marital status: Married    Spouse name: Not on file   Number of children: Not on file   Years of education: Not on file   Highest education level: Not on file  Occupational History   Not on file  Tobacco Use   Smoking status: Former    Current packs/day: 0.00    Average packs/day: 0.1 packs/day for 20.0 years (2.0 ttl pk-yrs)    Types: Cigarettes    Start date: 07/24/1953    Quit date: 07/24/1973    Years since quitting: 51.0   Smokeless tobacco: Never   Tobacco comments:    quit in 1975  Substance and Sexual Activity   Alcohol use: Yes    Alcohol/week: 3.0 standard drinks of alcohol    Types: 3 Standard drinks or equivalent per week   Drug use: No   Sexual activity: Not Currently    Birth control/protection: None  Other Topics Concern   Not on file  Social History Narrative   Widowed 2008 due to alzheimers. Lives alone. 2 children. 3 grandchildren (all went to [facility])   Completely independent. 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       Work 3 days a week at [employer].       Hobbies: goes out 3 days a week, goes to [employer], plays cards, goes to movies, walks, church      Social Drivers of Health   Tobacco Use: Medium Risk (05/22/2024)   Patient History    Smoking Tobacco Use: Former    Smokeless Tobacco Use: Never    Passive Exposure: Not on Actuary Strain: Not on file  Food Insecurity: Not on file  Transportation Needs: Not on file  Physical Activity: Not on file  Stress: Not on file  Social Connections: Not on file  Depression (EYV7-0): Not on file  Alcohol Screen: Not on file  Housing: Not on file  Utilities: Not on file  Health Literacy: Not on file   Allergies[1] Family History  Problem Relation Age of Onset   AAA (abdominal aortic aneurysm) Mother        smoker   Hypertension Mother    Breast cancer Neg Hx     Current Outpatient Medications (Cardiovascular):    amLODipine  (NORVASC ) 5 MG tablet, take 1 tablet by mouth  At bed time   indapamide  (LOZOL ) 1.25 MG tablet, TAKE ONE TABLET BY MOUTH  EVERY MORNING  Current Outpatient Medications (Other):    ALPRAZolam  (XANAX ) 0.5 MG tablet, Take 0.5 mg by mouth 2 (two) times daily as needed.   doxycycline  (VIBRA -TABS) 100 MG tablet, Take 1 tablet (100 mg total) by mouth 2 (two) times daily.   latanoprost (XALATAN) 0.005 % ophthalmic solution,    Multiple Vitamin (MULTIVITAMIN) tablet, Take 1 tablet by mouth daily.     Reviewed prior external information including notes and imaging from  primary care provider As well as notes that were available from care everywhere and other healthcare systems.  Past medical history, social, surgical and family history all reviewed in electronic medical record.  No pertanent information unless stated regarding to the chief complaint.   Review of Systems:  No headache, visual changes, nausea, vomiting, diarrhea, constipation, dizziness, abdominal pain, skin  rash, fevers, chills, night sweats, weight loss, swollen lymph nodes, body aches, joint swelling, chest pain, shortness of breath, mood changes. POSITIVE muscle aches  Objective  Blood pressure 126/88, pulse 68, height 5' 1 (1.549 m), weight 140 lb (63.5 kg), SpO2 96%.   General: No apparent distress alert and oriented x3 mood and affect normal, dressed appropriately.  HEENT: Pupils equal, extraocular movements intact  Respiratory: Patient's speak in full sentences and does not appear short of breath  Cardiovascular: No lower extremity edema, non tender, no erythema  Rotator cuff is 2 out of 5 strength.  Limited range of motion in all planes.  Patient does have some swelling of the glenohumeral joint bilaterally right greater than left.  Procedure: Real-time Ultrasound Guided Injection of right glenohumeral joint Device: GE Logiq Q7  Ultrasound guided injection is preferred based studies that show increased duration, increased effect, greater accuracy, decreased procedural pain, increased response rate with ultrasound guided versus blind injection.  Verbal informed consent obtained.  Time-out conducted.  Noted no overlying erythema, induration, or other signs of local infection.  Skin prepped in a sterile fashion.  Local anesthesia: Topical Ethyl chloride.  With sterile technique and under real time ultrasound guidance:  Joint visualized.  23g 1  inch needle inserted posterior approach. Pictures taken for needle placement. Patient did have injection of  2 cc of 0.5% Marcaine, and 1.0 cc of Kenalog 40 mg/dL. Completed without difficulty  Pain immediately resolved suggesting accurate placement of the medication.  Advised to call if fevers/chills, erythema, induration, drainage, or persistent bleeding.  Impression: Technically successful ultrasound guided injection.  Procedure: Real-time Ultrasound Guided Injection of left glenohumeral joint Device: GE Logiq E  Ultrasound guided injection  is preferred based studies that show increased duration, increased effect, greater accuracy, decreased procedural pain, increased response rate with ultrasound guided versus blind injection.  Verbal informed consent obtained.  Time-out conducted.  Noted no overlying erythema, induration, or other signs of local infection.  Skin prepped in a sterile fashion.  Local anesthesia: Topical Ethyl chloride.  With sterile technique and under real time ultrasound guidance:  Joint visualized.  21g 2 inch needle inserted posterior approach. Pictures taken for needle placement. Patient did have injection of 2 cc of 0.5% Marcaine, and 1cc of Kenalog 40 mg/dL. Completed without difficulty  Pain immediately resolved suggesting accurate placement of the medication.  Advised to call if fevers/chills, erythema, induration, drainage, or persistent bleeding.  Images permanently stored and available for review in the ultrasound unit.  Impression: Technically successful ultrasound guided injection.    Impression and Recommendations:     The above documentation has been reviewed and is accurate and complete  Arthea CHRISTELLA Sharps, DO       [1]  Allergies Allergen Reactions   Codeine    Penicillins    "

## 2024-07-22 ENCOUNTER — Ambulatory Visit: Admitting: Family Medicine

## 2024-07-22 ENCOUNTER — Other Ambulatory Visit: Payer: Self-pay

## 2024-07-22 ENCOUNTER — Encounter: Payer: Self-pay | Admitting: Family Medicine

## 2024-07-22 VITALS — BP 126/88 | HR 68 | Ht 61.0 in | Wt 140.0 lb

## 2024-07-22 DIAGNOSIS — M12811 Other specific arthropathies, not elsewhere classified, right shoulder: Secondary | ICD-10-CM | POA: Diagnosis not present

## 2024-07-22 DIAGNOSIS — M12812 Other specific arthropathies, not elsewhere classified, left shoulder: Secondary | ICD-10-CM

## 2024-07-22 DIAGNOSIS — G8929 Other chronic pain: Secondary | ICD-10-CM | POA: Diagnosis not present

## 2024-07-22 DIAGNOSIS — M25512 Pain in left shoulder: Secondary | ICD-10-CM | POA: Diagnosis not present

## 2024-07-22 DIAGNOSIS — M25511 Pain in right shoulder: Secondary | ICD-10-CM | POA: Diagnosis not present

## 2024-07-22 NOTE — Assessment & Plan Note (Signed)
 Chronic problem with exacerbation.  Due to other comorbidities and patient's age she is not a surgical candidate.  Does respond well to injections when needed.  Follow-up with me again in 3 months for further evaluation and treatment.  Has needed aspiration in the past but likely this time did not

## 2024-07-22 NOTE — Patient Instructions (Signed)
 Good to see you! Happy New Year See you again in 10-12 weeks

## 2024-09-30 ENCOUNTER — Ambulatory Visit: Admitting: Family Medicine
